# Patient Record
Sex: Female | Born: 1975 | Race: Black or African American | Hispanic: No | Marital: Married | Smoking: Never smoker
Health system: Southern US, Community
[De-identification: ages and names within clinical notes are randomized; demographics above are authoritative.]

## PROBLEM LIST (undated history)

## (undated) DIAGNOSIS — I10 Essential (primary) hypertension: Secondary | ICD-10-CM

## (undated) DIAGNOSIS — G629 Polyneuropathy, unspecified: Secondary | ICD-10-CM

## (undated) DIAGNOSIS — I219 Acute myocardial infarction, unspecified: Secondary | ICD-10-CM

## (undated) DIAGNOSIS — E669 Obesity, unspecified: Secondary | ICD-10-CM

## (undated) DIAGNOSIS — E119 Type 2 diabetes mellitus without complications: Secondary | ICD-10-CM

## (undated) HISTORY — PX: CAROTID STENT: SHX1301

## (undated) HISTORY — PX: TUBAL LIGATION: SHX77

## (undated) HISTORY — PX: ABLATION: SHX5711

---

## 2013-03-27 ENCOUNTER — Emergency Department (HOSPITAL_BASED_OUTPATIENT_CLINIC_OR_DEPARTMENT_OTHER)
Admission: EM | Admit: 2013-03-27 | Discharge: 2013-03-27 | Disposition: A | Discharge status: Home / Self Care | Payer: Medicaid Other | Attending: Emergency Medicine | Admitting: Emergency Medicine

## 2013-03-27 ENCOUNTER — Encounter (HOSPITAL_BASED_OUTPATIENT_CLINIC_OR_DEPARTMENT_OTHER): Payer: Self-pay | Admitting: Emergency Medicine

## 2013-03-27 DIAGNOSIS — Z8669 Personal history of other diseases of the nervous system and sense organs: Secondary | ICD-10-CM | POA: Insufficient documentation

## 2013-03-27 DIAGNOSIS — E119 Type 2 diabetes mellitus without complications: Secondary | ICD-10-CM | POA: Insufficient documentation

## 2013-03-27 DIAGNOSIS — I252 Old myocardial infarction: Secondary | ICD-10-CM | POA: Insufficient documentation

## 2013-03-27 DIAGNOSIS — Z7982 Long term (current) use of aspirin: Secondary | ICD-10-CM | POA: Insufficient documentation

## 2013-03-27 DIAGNOSIS — I10 Essential (primary) hypertension: Secondary | ICD-10-CM | POA: Insufficient documentation

## 2013-03-27 DIAGNOSIS — Z9889 Other specified postprocedural states: Secondary | ICD-10-CM | POA: Insufficient documentation

## 2013-03-27 DIAGNOSIS — Z7902 Long term (current) use of antithrombotics/antiplatelets: Secondary | ICD-10-CM | POA: Insufficient documentation

## 2013-03-27 DIAGNOSIS — E876 Hypokalemia: Secondary | ICD-10-CM | POA: Insufficient documentation

## 2013-03-27 DIAGNOSIS — Z880 Allergy status to penicillin: Secondary | ICD-10-CM | POA: Insufficient documentation

## 2013-03-27 DIAGNOSIS — Z79899 Other long term (current) drug therapy: Secondary | ICD-10-CM | POA: Insufficient documentation

## 2013-03-27 DIAGNOSIS — M25519 Pain in unspecified shoulder: Secondary | ICD-10-CM | POA: Insufficient documentation

## 2013-03-27 DIAGNOSIS — M25512 Pain in left shoulder: Secondary | ICD-10-CM

## 2013-03-27 DIAGNOSIS — Z794 Long term (current) use of insulin: Secondary | ICD-10-CM | POA: Insufficient documentation

## 2013-03-27 HISTORY — DX: Type 2 diabetes mellitus without complications: E11.9

## 2013-03-27 HISTORY — DX: Acute myocardial infarction, unspecified: I21.9

## 2013-03-27 HISTORY — DX: Polyneuropathy, unspecified: G62.9

## 2013-03-27 HISTORY — DX: Essential (primary) hypertension: I10

## 2013-03-27 LAB — CBC WITH DIFFERENTIAL/PLATELET
Basophils Absolute: 0 10*3/uL (ref 0.0–0.1)
Basophils Relative: 0 % (ref 0–1)
EOS ABS: 0.2 10*3/uL (ref 0.0–0.7)
Eosinophils Relative: 2 % (ref 0–5)
HCT: 39.9 % (ref 36.0–46.0)
HEMOGLOBIN: 13.2 g/dL (ref 12.0–15.0)
LYMPHS ABS: 4.3 10*3/uL — AB (ref 0.7–4.0)
LYMPHS PCT: 42 % (ref 12–46)
MCH: 26.6 pg (ref 26.0–34.0)
MCHC: 33.1 g/dL (ref 30.0–36.0)
MCV: 80.4 fL (ref 78.0–100.0)
Monocytes Absolute: 0.7 10*3/uL (ref 0.1–1.0)
Monocytes Relative: 7 % (ref 3–12)
NEUTROS ABS: 4.9 10*3/uL (ref 1.7–7.7)
NEUTROS PCT: 48 % (ref 43–77)
PLATELETS: 441 10*3/uL — AB (ref 150–400)
RBC: 4.96 MIL/uL (ref 3.87–5.11)
RDW: 14.6 % (ref 11.5–15.5)
WBC: 10.1 10*3/uL (ref 4.0–10.5)

## 2013-03-27 LAB — COMPREHENSIVE METABOLIC PANEL
ALK PHOS: 70 U/L (ref 39–117)
ALT: 17 U/L (ref 0–35)
AST: 16 U/L (ref 0–37)
Albumin: 3.8 g/dL (ref 3.5–5.2)
BUN: 16 mg/dL (ref 6–23)
CALCIUM: 9.7 mg/dL (ref 8.4–10.5)
CHLORIDE: 96 meq/L (ref 96–112)
CO2: 30 meq/L (ref 19–32)
Creatinine, Ser: 1 mg/dL (ref 0.50–1.10)
GFR calc Af Amer: 81 mL/min — ABNORMAL LOW (ref >90)
GFR, EST NON AFRICAN AMERICAN: 70 mL/min — AB (ref >90)
Glucose, Bld: 241 mg/dL — ABNORMAL HIGH (ref 70–99)
POTASSIUM: 2.9 meq/L — AB (ref 3.7–5.3)
SODIUM: 140 meq/L (ref 137–147)
Total Bilirubin: 0.5 mg/dL (ref 0.3–1.2)
Total Protein: 8.2 g/dL (ref 6.0–8.3)

## 2013-03-27 LAB — TROPONIN I: Troponin I: <0.3 ng/mL (ref <0.30)

## 2013-03-27 MED ORDER — POTASSIUM CHLORIDE ER 20 MEQ PO TBCR: 10.0000 meq | EXTENDED_RELEASE_TABLET | Freq: Two times a day (BID) | ORAL | Status: DC

## 2013-03-27 MED ORDER — CYCLOBENZAPRINE HCL 10 MG PO TABS: 10.0000 mg | ORAL_TABLET | Freq: Three times a day (TID) | ORAL | Status: DC | PRN

## 2013-03-27 MED ORDER — HYDROCODONE-ACETAMINOPHEN 7.5-325 MG PO TABS: 1.0000 | ORAL_TABLET | Freq: Four times a day (QID) | ORAL | Status: DC | PRN

## 2013-03-27 NOTE — ED Provider Notes (Signed)
I saw and evaluated the patient, reviewed the resident's note and I agree with the findings and plan.   EKG Interpretation   Date/Time:  Tuesday March 27 2013 08:42:04 EST Ventricular Rate:  76 PR Interval:  194 QRS Duration: 96 QT Interval:  420 QTC Calculation: 472 R Axis:   29 Text Interpretation:  Normal sinus rhythm Nonspecific T wave abnormality  Prolonged QT No previous tracing Confirmed by Anitra LauthPLUNKETT  MD, Alphonzo LemmingsWHITNEY  [phone]([id number]) on 03/27/2013 9:23:52 AM      I have reviewed EKG and agree with the resident interpretation.    Pt with atypical chest pain that appears to be musculoskeletal that started in the shoulder and reproduced with palpation.  No associated sx and no lower chest pain or substernal pain.  Pt is N/V intact and pt well appearing.  Pain started 6hours pta with neg trop and labs other than mild hypokalemia.  Plain films neg.  Discussed findings with pt and she is comfortable going home and return for worsening sx.  Given muscle relaxor, pain control and f/u.  Gwyneth SproutWhitney Zarielle Cea, MD 03/27/13 1537

## 2013-03-27 NOTE — ED Notes (Signed)
Pt states yesterday her mid chest was a little achy thought she was tired this am at 0430 at work started having more discomfort pain in left arm and shoulder as well as some discomfort in her left jaw

## 2013-03-27 NOTE — ED Provider Notes (Signed)
CSN: [id number]     Arrival date & time 03/27/13  0828 History   First MD Initiated Contact with Patient 03/27/13 [phone]     Chief Complaint  Patient presents with  . left arm pain jaw pain    HPI  Theresa Maxwell is 38 y.o. female with a prior history of HTN, DM and MI (09/2011) and recent carotid stent placement (09/2011).Three weeks ago she noticed left shoulder aches when she is lying down. She does not sleep on that side. She reports the pain as throbbing and aching and radiates to breast area. Pain stays in shoulder and does not radiates down arm. Patient works with her Water engineer. Last night (4:30 am) she noticed chest discomfort in the center of her chest, that waxs and wanes. She rates that discomfort as 6/10. She reports the "pain would occur every 2 minutes, lasting as long as an hour at times." She has not taken anything for the pain. She can not take NSAIDS d/t plavix use. She denies injury or trauma.  She takes her ASA and plavix daily as recommended. She denies nausea, vomit or diaphoresis. She has not had fever, cough, shortness of breath or abdominal discomfort. She follows regularly with cardiologist at [facility], recently seen in January.   Past Medical History  Diagnosis Date  . Hypertension   . Diabetes mellitus without complication   . Heart attack   . Neuropathy    Past Surgical History  Procedure Laterality Date  . Carotid stent    . Ablation    . Tubal ligation     History reviewed. No pertinent family history. History  Substance Use Topics  . Smoking status: Never Smoker   . Smokeless tobacco: Not on file  . Alcohol Use: No   OB History   Grav Para Term Preterm Abortions TAB SAB Ect Mult Living                 Review of Systems    Allergies  Penicillins and Percocet  Home Medications   Current Outpatient Rx  Name  Route  Sig  Dispense  Refill  . amLODipine (NORVASC) 10 MG tablet   Oral   Take 5 mg by mouth daily.         Marland Kitchen aspirin  81 MG tablet   Oral   Take 81 mg by mouth daily.         . clopidogrel (PLAVIX) 75 MG tablet   Oral   Take 75 mg by mouth daily with breakfast.         . hydrochlorothiazide (HYDRODIURIL) 50 MG tablet   Oral   Take 50 mg by mouth daily.         . insulin glargine (LANTUS) 100 UNIT/ML injection   Subcutaneous   Inject into the skin at bedtime.         Marland Kitchen losartan (COZAAR) 25 MG tablet   Oral   Take 25 mg by mouth daily.         . metoprolol tartrate (LOPRESSOR) 25 MG tablet   Oral   Take 25 mg by mouth 2 (two) times daily.          BP 115/74  Pulse 68  Temp(Src) 98.7 F (37.1 C) (Oral)  Resp 20  Ht 5\' 9"  (1.753 m)  Wt 353 lb (160.12 kg)  BMI 52.11 kg/m2  SpO2 98%  LMP 03/26/2013 Physical Exam Gen: NAD.  HEENT: AT. [place]. Bilateral TM visualized and normal  in appearance. Bilateral eyes with injections, no icterus. MMM.  CV: RRR, no murmur Chest: CTAB, no wheeze or crackles. TTP over pectoralis.  Abd: Soft. Morbidly obese. NTND. BS present.  Ext: No erythema. Trace edema.  Skin: No rashes, purpura or petechiae.  Neuro:  PERLA. EOMi. Alert. Grossly intact.  MSK: TTP over pectoralis/chest wall on left. Pain with flexion against resistance in left chest and shoulder.   ED Course  Procedures (including critical care time) Labs Review Labs Reviewed  CBC WITH DIFFERENTIAL - Abnormal; Notable for the following:    Platelets 441 (*)    Lymphs Abs 4.3 (*)    All other components within normal limits  COMPREHENSIVE METABOLIC PANEL - Abnormal; Notable for the following:    Potassium 2.9 (*)    Glucose, Bld 241 (*)    GFR calc non Af Amer 70 (*)    GFR calc Af Amer 81 (*)    All other components within normal limits  TROPONIN I   Imaging Review No results found.   EKG Interpretation   Date/Time:  Tuesday March 27 2013 08:42:04 EST Ventricular Rate:  76 PR Interval:  194 QRS Duration: 96 QT Interval:  420 QTC Calculation: 472 R Axis:   29 Text  Interpretation:  Normal sinus rhythm Nonspecific T wave abnormality  Prolonged QT No previous tracing Confirmed by Anitra LauthPLUNKETT  MD, WHITNEY  [phone]([id number]) on 03/27/2013 9:23:52 AM      MDM   Final diagnoses:  None   Patient was monitored, EKG obtained and was normal, outside of mild prolonged QT. Labs resulted with negative trop and low potassium (2.9). Initial BP was elevated, with repeat BP 115/74. Chest pain was reproducible to palpation. With Physical exam and negative trop and ekg pain is likely d/t musculoskeletal pain. Patient given prescription for flexeril, Vicodin and potassium. Encourage to use heat to shoulder/pectoralis. She was advised to follow up with her PCP and cardiologist. She was given a work excuse for light restriction for 1 week.     [name], DO 03/27/13 1024

## 2013-03-27 NOTE — Discharge Instructions (Signed)

## 2013-03-31 ENCOUNTER — Emergency Department (HOSPITAL_BASED_OUTPATIENT_CLINIC_OR_DEPARTMENT_OTHER)
Admission: EM | Admit: 2013-03-31 | Discharge: 2013-03-31 | Disposition: A | Discharge status: Home / Self Care | Payer: Medicaid Other | Attending: Emergency Medicine | Admitting: Emergency Medicine

## 2013-03-31 ENCOUNTER — Encounter (HOSPITAL_BASED_OUTPATIENT_CLINIC_OR_DEPARTMENT_OTHER): Payer: Self-pay | Admitting: Emergency Medicine

## 2013-03-31 DIAGNOSIS — Z794 Long term (current) use of insulin: Secondary | ICD-10-CM | POA: Insufficient documentation

## 2013-03-31 DIAGNOSIS — Z7982 Long term (current) use of aspirin: Secondary | ICD-10-CM | POA: Insufficient documentation

## 2013-03-31 DIAGNOSIS — E119 Type 2 diabetes mellitus without complications: Secondary | ICD-10-CM | POA: Insufficient documentation

## 2013-03-31 DIAGNOSIS — Z79899 Other long term (current) drug therapy: Secondary | ICD-10-CM | POA: Insufficient documentation

## 2013-03-31 DIAGNOSIS — I252 Old myocardial infarction: Secondary | ICD-10-CM | POA: Insufficient documentation

## 2013-03-31 DIAGNOSIS — Z8669 Personal history of other diseases of the nervous system and sense organs: Secondary | ICD-10-CM | POA: Insufficient documentation

## 2013-03-31 DIAGNOSIS — I10 Essential (primary) hypertension: Secondary | ICD-10-CM | POA: Insufficient documentation

## 2013-03-31 DIAGNOSIS — J029 Acute pharyngitis, unspecified: Secondary | ICD-10-CM | POA: Insufficient documentation

## 2013-03-31 DIAGNOSIS — R51 Headache: Secondary | ICD-10-CM | POA: Insufficient documentation

## 2013-03-31 DIAGNOSIS — E669 Obesity, unspecified: Secondary | ICD-10-CM | POA: Insufficient documentation

## 2013-03-31 DIAGNOSIS — Z7902 Long term (current) use of antithrombotics/antiplatelets: Secondary | ICD-10-CM | POA: Insufficient documentation

## 2013-03-31 DIAGNOSIS — Z880 Allergy status to penicillin: Secondary | ICD-10-CM | POA: Insufficient documentation

## 2013-03-31 HISTORY — DX: Obesity, unspecified: E66.9

## 2013-03-31 LAB — RAPID STREP SCREEN (MED CTR MEBANE ONLY): Streptococcus, Group A Screen (Direct): NEGATIVE

## 2013-03-31 MED ORDER — HYDROCODONE-ACETAMINOPHEN 7.5-325 MG/15ML PO SOLN: 10.0000 mL | ORAL | Status: DC | PRN

## 2013-03-31 NOTE — ED Notes (Signed)
Sore throat, cough and HA that started yesterday. Pt only able to speak in whisper.

## 2013-03-31 NOTE — ED Provider Notes (Signed)
CSN: [id number]     Arrival date & time 03/31/13  0207 History   First MD Initiated Contact with Patient 03/31/13 0310     Chief Complaint  Patient presents with  . Sore Throat     (Consider location/radiation/quality/duration/timing/severity/associated sxs/prior Treatment) HPI This is a 38 year old female with a two-day history of sore throat, cough, headache and dysphonia. It is worsened. Her symptoms are moderate. She denies fever, chills, shortness of breath, nausea, vomiting or diarrhea. Her sore throat is worse with swallowing. She is also having some pain in her left ear.  Past Medical History  Diagnosis Date  . Hypertension   . Diabetes mellitus without complication   . Heart attack   . Neuropathy   . Obesity    Past Surgical History  Procedure Laterality Date  . Carotid stent    . Ablation    . Tubal ligation     No family history on file. History  Substance Use Topics  . Smoking status: Never Smoker   . Smokeless tobacco: Not on file  . Alcohol Use: No   OB History   Grav Para Term Preterm Abortions TAB SAB Ect Mult Living                 Review of Systems  All other systems reviewed and are negative.      Allergies  Penicillins and Percocet  Home Medications   Current Outpatient Rx  Name  Route  Sig  Dispense  Refill  . Canagliflozin (INVOKANA) 100 MG TABS   Oral   Take 100 mg by mouth daily.         Marland Kitchen amLODipine (NORVASC) 10 MG tablet   Oral   Take 5 mg by mouth daily.         Marland Kitchen aspirin 81 MG tablet   Oral   Take 81 mg by mouth daily.         . clopidogrel (PLAVIX) 75 MG tablet   Oral   Take 75 mg by mouth daily with breakfast.         . cyclobenzaprine (FLEXERIL) 10 MG tablet   Oral   Take 1 tablet (10 mg total) by mouth 3 (three) times daily as needed for muscle spasms.   15 tablet   0   . hydrochlorothiazide (HYDRODIURIL) 50 MG tablet   Oral   Take 50 mg by mouth daily.         Marland Kitchen HYDROcodone-acetaminophen (NORCO)  7.5-325 MG per tablet   Oral   Take 1 tablet by mouth every 6 (six) hours as needed for moderate pain.   15 tablet   0   . insulin glargine (LANTUS) 100 UNIT/ML injection   Subcutaneous   Inject into the skin at bedtime.         Marland Kitchen losartan (COZAAR) 25 MG tablet   Oral   Take 25 mg by mouth daily.         . metoprolol tartrate (LOPRESSOR) 25 MG tablet   Oral   Take 25 mg by mouth 2 (two) times daily.         . potassium chloride 20 MEQ TBCR   Oral   Take 10 mEq by mouth 2 (two) times daily.   10 tablet   0    BP 133/80  Pulse 89  Temp(Src) 98.8 F (37.1 C) (Oral)  Resp 16  Ht 5\' 9"  (1.753 m)  Wt 353 lb (160.12 kg)  BMI 52.11 kg/m2  SpO2  99%  LMP 03/26/2013  Physical Exam General: Well-developed, obese female in no acute distress; appearance consistent with age of record HENT: normocephalic; atraumatic; TMs normal; a few petechiae of the soft palate; tonsils enlarged without exudate; dysphonia; no trismus; uvula midline Eyes: pupils equal, round and reactive to light; extraocular muscles intact Neck: supple; no lymphadenopathy Heart: regular rate and rhythm Lungs: clear to auscultation bilaterally Abdomen: soft; obese; nontender; bowel sounds present Extremities: No deformity; full range of motion Neurologic: Awake, alert and oriented; motor function intact in all extremities and symmetric; no facial droop Skin: Warm and dry Psychiatric: Normal mood and affect    ED Course  Procedures (including critical care time)   MDM   Nursing notes and vitals signs, including pulse oximetry, reviewed.  Summary of this visit's results, reviewed by myself:  Labs:  Results for orders placed during the hospital encounter of 03/31/13 (from the past 24 hour(s))  RAPID STREP SCREEN     Status: None   Collection Time    03/31/13  2:50 AM      Result Value Ref Range   Streptococcus, Group A Screen (Direct) NEGATIVE  NEGATIVE       Hanley SeamenJohn L Seward Coran, MD 03/31/13  [phone]

## 2013-04-02 LAB — CULTURE, GROUP A STREP

## 2013-05-24 ENCOUNTER — Emergency Department (HOSPITAL_BASED_OUTPATIENT_CLINIC_OR_DEPARTMENT_OTHER)
Admission: EM | Admit: 2013-05-24 | Discharge: 2013-05-24 | Disposition: A | Discharge status: Other Acute Inpatient Hospital | Payer: Medicaid Other | Attending: Emergency Medicine | Admitting: Emergency Medicine

## 2013-05-24 ENCOUNTER — Encounter (HOSPITAL_BASED_OUTPATIENT_CLINIC_OR_DEPARTMENT_OTHER): Payer: Self-pay | Admitting: Emergency Medicine

## 2013-05-24 DIAGNOSIS — E119 Type 2 diabetes mellitus without complications: Secondary | ICD-10-CM | POA: Diagnosis not present

## 2013-05-24 DIAGNOSIS — Z8669 Personal history of other diseases of the nervous system and sense organs: Secondary | ICD-10-CM | POA: Insufficient documentation

## 2013-05-24 DIAGNOSIS — I10 Essential (primary) hypertension: Secondary | ICD-10-CM | POA: Diagnosis not present

## 2013-05-24 DIAGNOSIS — I2510 Atherosclerotic heart disease of native coronary artery without angina pectoris: Secondary | ICD-10-CM | POA: Diagnosis not present

## 2013-05-24 DIAGNOSIS — Z79899 Other long term (current) drug therapy: Secondary | ICD-10-CM | POA: Diagnosis not present

## 2013-05-24 DIAGNOSIS — E669 Obesity, unspecified: Secondary | ICD-10-CM | POA: Diagnosis not present

## 2013-05-24 DIAGNOSIS — Z7982 Long term (current) use of aspirin: Secondary | ICD-10-CM | POA: Insufficient documentation

## 2013-05-24 DIAGNOSIS — R079 Chest pain, unspecified: Secondary | ICD-10-CM

## 2013-05-24 DIAGNOSIS — Z9861 Coronary angioplasty status: Secondary | ICD-10-CM | POA: Diagnosis not present

## 2013-05-24 DIAGNOSIS — Z880 Allergy status to penicillin: Secondary | ICD-10-CM | POA: Diagnosis not present

## 2013-05-24 DIAGNOSIS — Z7902 Long term (current) use of antithrombotics/antiplatelets: Secondary | ICD-10-CM | POA: Insufficient documentation

## 2013-05-24 DIAGNOSIS — Z794 Long term (current) use of insulin: Secondary | ICD-10-CM | POA: Insufficient documentation

## 2013-05-24 DIAGNOSIS — I252 Old myocardial infarction: Secondary | ICD-10-CM | POA: Insufficient documentation

## 2013-05-24 LAB — BASIC METABOLIC PANEL
BUN: 12 mg/dL (ref 6–23)
CHLORIDE: 99 meq/L (ref 96–112)
CO2: 30 meq/L (ref 19–32)
Calcium: 9.5 mg/dL (ref 8.4–10.5)
Creatinine, Ser: 0.8 mg/dL (ref 0.50–1.10)
GFR calc Af Amer: >90 mL/min (ref >90)
GFR calc non Af Amer: >90 mL/min (ref >90)
Glucose, Bld: 146 mg/dL — ABNORMAL HIGH (ref 70–99)
Potassium: 3 mEq/L — ABNORMAL LOW (ref 3.7–5.3)
SODIUM: 141 meq/L (ref 137–147)

## 2013-05-24 LAB — CBC WITH DIFFERENTIAL/PLATELET
Basophils Absolute: 0 10*3/uL (ref 0.0–0.1)
Basophils Relative: 0 % (ref 0–1)
EOS PCT: 2 % (ref 0–5)
Eosinophils Absolute: 0.2 10*3/uL (ref 0.0–0.7)
HCT: 39.7 % (ref 36.0–46.0)
Hemoglobin: 13.3 g/dL (ref 12.0–15.0)
LYMPHS ABS: 3.6 10*3/uL (ref 0.7–4.0)
Lymphocytes Relative: 31 % (ref 12–46)
MCH: 27.1 pg (ref 26.0–34.0)
MCHC: 33.5 g/dL (ref 30.0–36.0)
MCV: 81 fL (ref 78.0–100.0)
Monocytes Absolute: 0.7 10*3/uL (ref 0.1–1.0)
Monocytes Relative: 6 % (ref 3–12)
NEUTROS PCT: 61 % (ref 43–77)
Neutro Abs: 7.1 10*3/uL (ref 1.7–7.7)
PLATELETS: 446 10*3/uL — AB (ref 150–400)
RBC: 4.9 MIL/uL (ref 3.87–5.11)
RDW: 14.1 % (ref 11.5–15.5)
WBC: 11.6 10*3/uL — ABNORMAL HIGH (ref 4.0–10.5)

## 2013-05-24 LAB — TROPONIN I

## 2013-05-24 MED ORDER — POTASSIUM CHLORIDE 20 MEQ/15ML (10%) PO LIQD: 20.0000 meq | Freq: Every day | ORAL | Status: DC

## 2013-05-24 MED ORDER — POTASSIUM CHLORIDE CRYS ER 20 MEQ PO TBCR
40.0000 meq | EXTENDED_RELEASE_TABLET | Freq: Once | ORAL | Status: AC
  Administered 2013-05-24: 40 meq via ORAL
  Filled 2013-05-24: qty 2

## 2013-05-24 MED ORDER — NITROGLYCERIN 0.4 MG SL SUBL
0.4000 mg | SUBLINGUAL_TABLET | SUBLINGUAL | Status: DC | PRN
  Filled 2013-05-24: qty 25
  Filled 2013-05-24: qty 1

## 2013-05-24 MED ORDER — ASPIRIN 81 MG PO CHEW
324.0000 mg | CHEWABLE_TABLET | Freq: Once | ORAL | Status: AC
  Administered 2013-05-24: 324 mg via ORAL
  Filled 2013-05-24: qty 4

## 2013-05-24 NOTE — ED Notes (Signed)
Pt denies chest pain at this time, SL NTG not given

## 2013-05-24 NOTE — ED Notes (Signed)
Chest pain started while working around 1 am, denies n/v, no SOB or diaphoresis.

## 2013-05-24 NOTE — ED Notes (Signed)
Pt states that her last name was spelled incorrectly as Merilynn FinlandRobertson previously and it was corrected to ThermalitoRobinson. Therefore, the  EKG is reading as a mismatch now.

## 2013-05-24 NOTE — ED Provider Notes (Signed)
CSN: [id number]     Arrival date & time 05/24/13  0256 History   First MD Initiated Contact with Patient 05/24/13 [phone]     Chief Complaint  Patient presents with  . Chest Pain     (Consider location/radiation/quality/duration/timing/severity/associated sxs/prior Treatment) HPI 38 year old female with a history of diabetes mellitus and coronary artery disease status post cardiac stent. She is here with chest pain that began about 2 hours ago. The pain was initially in her right jaw and then moved down into her substernal region. She describes it as a dull pain but characterized as like her previous MI. It has been severe at its worst. It has been coming and going. It is worse with activity and better with rest. There is no associated shortness of breath, diaphoresis, nausea or vomiting. The pain is not present at this time.  Past Medical History  Diagnosis Date  . Hypertension   . Diabetes mellitus without complication   . Heart attack   . Neuropathy   . Obesity    Past Surgical History  Procedure Laterality Date  . Carotid stent    . Ablation    . Tubal ligation     History reviewed. No pertinent family history. History  Substance Use Topics  . Smoking status: Never Smoker   . Smokeless tobacco: Not on file  . Alcohol Use: No   OB History   Grav Para Term Preterm Abortions TAB SAB Ect Mult Living                 Review of Systems  All other systems reviewed and are negative.  Allergies  Penicillins and Percocet  Home Medications   Prior to Admission medications   Medication Sig Start Date End Date Taking? Authorizing Provider  amLODipine (NORVASC) 10 MG tablet Take 5 mg by mouth daily.   Yes Historical Provider, MD  aspirin 81 MG tablet Take 81 mg by mouth daily.   Yes Historical Provider, MD  Canagliflozin (INVOKANA) 100 MG TABS Take 100 mg by mouth daily.   Yes Historical Provider, MD  clopidogrel (PLAVIX) 75 MG tablet Take 75 mg by mouth daily with breakfast.    Yes Historical Provider, MD  hydrochlorothiazide (HYDRODIURIL) 50 MG tablet Take 50 mg by mouth daily.   Yes Historical Provider, MD  insulin glargine (LANTUS) 100 UNIT/ML injection Inject into the skin at bedtime.   Yes Historical Provider, MD  losartan (COZAAR) 25 MG tablet Take 25 mg by mouth daily.   Yes Historical Provider, MD  metoprolol tartrate (LOPRESSOR) 25 MG tablet Take 25 mg by mouth 2 (two) times daily.   Yes Historical Provider, MD  cyclobenzaprine (FLEXERIL) 10 MG tablet Take 1 tablet (10 mg total) by mouth 3 (three) times daily as needed for muscle spasms. 03/27/13   Renee A Kuneff, DO  HYDROcodone-acetaminophen (HYCET) 7.5-325 mg/15 ml solution Take 10 mLs by mouth every 4 (four) hours as needed (for pain). 03/31/13   Carlisle BeersJohn L Taisley Mordan, MD  HYDROcodone-acetaminophen (NORCO) 7.5-325 MG per tablet Take 1 tablet by mouth every 6 (six) hours as needed for moderate pain. 03/27/13   Renee A Kuneff, DO  potassium chloride 20 MEQ TBCR Take 10 mEq by mouth 2 (two) times daily. 03/27/13   Renee A Kuneff, DO   BP 158/73  Pulse 80  Temp(Src) 98.6 F (37 C) (Oral)  Resp 18  Ht 5\' 9"  (1.753 m)  Wt 300 lb (136.079 kg)  BMI 44.28 kg/m2  SpO2 98%  Physical  Exam General: Well-developed, obese female in no acute distress; appearance consistent with age of record HENT: normocephalic; atraumatic Eyes: pupils equal, round and reactive to light; extraocular muscles intact Neck: supple Heart: regular rate and rhythm; no murmurs, rubs or gallops Lungs: clear to auscultation bilaterally Abdomen: soft; nondistended; nontender; bowel sounds present Extremities: No deformity; full range of motion; pulses normal Neurologic: Awake, alert and oriented; motor function intact in all extremities and symmetric; no facial droop Skin: Warm and dry Psychiatric: Normal mood and affect    ED Course  Procedures (including critical care time)   MDM  EKG Interpretation:  Date & Time: 05/24/2013 3:08 AM  Rate:  79  Rhythm: normal sinus rhythm  QRS Axis: normal  Intervals: QT prolonged  ST/T Wave abnormalities: nonspecific T wave changes  Conduction Disutrbances:none  Narrative Interpretation:   Old EKG Reviewed: unchanged  Nursing notes and vitals signs, including pulse oximetry, reviewed.  Summary of this visit's results, reviewed by myself:  Labs:  Results for orders placed during the hospital encounter of 05/24/13 (from the past 24 hour(s))  CBC WITH DIFFERENTIAL     Status: Abnormal   Collection Time    05/24/13  3:21 AM      Result Value Ref Range   WBC 11.6 (*) 4.0 - 10.5 K/uL   RBC 4.90  3.87 - 5.11 MIL/uL   Hemoglobin 13.3  12.0 - 15.0 g/dL   HCT 16.139.7  09.636.0 - 04.546.0 %   MCV 81.0  78.0 - 100.0 fL   MCH 27.1  26.0 - 34.0 pg   MCHC 33.5  30.0 - 36.0 g/dL   RDW 40.914.1  81.111.5 - 91.415.5 %   Platelets 446 (*) 150 - 400 K/uL   Neutrophils Relative % 61  43 - 77 %   Lymphocytes Relative 31  12 - 46 %   Monocytes Relative 6  3 - 12 %   Eosinophils Relative 2  0 - 5 %   Basophils Relative 0  0 - 1 %   Neutro Abs 7.1  1.7 - 7.7 K/uL   Lymphs Abs 3.6  0.7 - 4.0 K/uL   Monocytes Absolute 0.7  0.1 - 1.0 K/uL   Eosinophils Absolute 0.2  0.0 - 0.7 K/uL   Basophils Absolute 0.0  0.0 - 0.1 K/uL   Smear Review LARGE PLATELETS PRESENT    BASIC METABOLIC PANEL     Status: Abnormal   Collection Time    05/24/13  3:21 AM      Result Value Ref Range   Sodium 141  137 - 147 mEq/L   Potassium 3.0 (*) 3.7 - 5.3 mEq/L   Chloride 99  96 - 112 mEq/L   CO2 30  19 - 32 mEq/L   Glucose, Bld 146 (*) 70 - 99 mg/dL   BUN 12  6 - 23 mg/dL   Creatinine, Ser 7.820.80  0.50 - 1.10 mg/dL   Calcium 9.5  8.4 - 95.610.5 mg/dL   GFR calc non Af Amer >90  >90 mL/min   GFR calc Af Amer >90  >90 mL/min  TROPONIN I     Status: None   Collection Time    05/24/13  3:21 AM      Result Value Ref Range   Troponin I <0.30  <0.30 ng/mL   4:29 AM [facility]   Hanley SeamenJohn L Shaindel Sweeten,  MD 05/24/13 0430

## 2013-08-05 ENCOUNTER — Emergency Department (HOSPITAL_BASED_OUTPATIENT_CLINIC_OR_DEPARTMENT_OTHER)
Admission: EM | Admit: 2013-08-05 | Discharge: 2013-08-06 | Disposition: A | Discharge status: Home / Self Care | Payer: Medicaid Other | Attending: Emergency Medicine | Admitting: Emergency Medicine

## 2013-08-05 ENCOUNTER — Encounter (HOSPITAL_BASED_OUTPATIENT_CLINIC_OR_DEPARTMENT_OTHER): Payer: Self-pay | Admitting: Emergency Medicine

## 2013-08-05 DIAGNOSIS — I10 Essential (primary) hypertension: Secondary | ICD-10-CM | POA: Insufficient documentation

## 2013-08-05 DIAGNOSIS — Z7902 Long term (current) use of antithrombotics/antiplatelets: Secondary | ICD-10-CM | POA: Diagnosis not present

## 2013-08-05 DIAGNOSIS — R197 Diarrhea, unspecified: Secondary | ICD-10-CM | POA: Diagnosis not present

## 2013-08-05 DIAGNOSIS — E669 Obesity, unspecified: Secondary | ICD-10-CM | POA: Insufficient documentation

## 2013-08-05 DIAGNOSIS — Z3202 Encounter for pregnancy test, result negative: Secondary | ICD-10-CM | POA: Diagnosis not present

## 2013-08-05 DIAGNOSIS — R51 Headache: Secondary | ICD-10-CM | POA: Diagnosis not present

## 2013-08-05 DIAGNOSIS — R112 Nausea with vomiting, unspecified: Secondary | ICD-10-CM | POA: Diagnosis not present

## 2013-08-05 DIAGNOSIS — Z9851 Tubal ligation status: Secondary | ICD-10-CM | POA: Insufficient documentation

## 2013-08-05 DIAGNOSIS — R05 Cough: Secondary | ICD-10-CM | POA: Insufficient documentation

## 2013-08-05 DIAGNOSIS — Z880 Allergy status to penicillin: Secondary | ICD-10-CM | POA: Diagnosis not present

## 2013-08-05 DIAGNOSIS — Z7982 Long term (current) use of aspirin: Secondary | ICD-10-CM | POA: Insufficient documentation

## 2013-08-05 DIAGNOSIS — Z8669 Personal history of other diseases of the nervous system and sense organs: Secondary | ICD-10-CM | POA: Insufficient documentation

## 2013-08-05 DIAGNOSIS — R059 Cough, unspecified: Secondary | ICD-10-CM | POA: Diagnosis not present

## 2013-08-05 DIAGNOSIS — R1110 Vomiting, unspecified: Secondary | ICD-10-CM

## 2013-08-05 DIAGNOSIS — R519 Headache, unspecified: Secondary | ICD-10-CM

## 2013-08-05 DIAGNOSIS — Z794 Long term (current) use of insulin: Secondary | ICD-10-CM | POA: Diagnosis not present

## 2013-08-05 DIAGNOSIS — R1084 Generalized abdominal pain: Secondary | ICD-10-CM | POA: Diagnosis present

## 2013-08-05 DIAGNOSIS — Z9861 Coronary angioplasty status: Secondary | ICD-10-CM | POA: Diagnosis not present

## 2013-08-05 DIAGNOSIS — E119 Type 2 diabetes mellitus without complications: Secondary | ICD-10-CM | POA: Insufficient documentation

## 2013-08-05 DIAGNOSIS — Z79899 Other long term (current) drug therapy: Secondary | ICD-10-CM | POA: Diagnosis not present

## 2013-08-05 DIAGNOSIS — R1013 Epigastric pain: Secondary | ICD-10-CM | POA: Diagnosis not present

## 2013-08-05 DIAGNOSIS — I252 Old myocardial infarction: Secondary | ICD-10-CM | POA: Diagnosis not present

## 2013-08-05 MED ORDER — ONDANSETRON 8 MG PO TBDP: 8.0000 mg | ORAL_TABLET | Freq: Once | ORAL | Status: DC

## 2013-08-05 NOTE — ED Provider Notes (Signed)
CSN: [id number]     Arrival date & time 08/05/13  2344 History  This chart was scribed for Theresa Gaskinsonald W Concha Sudol, MD by Modena JanskyAlbert Thayil, ED Scribe. This patient was seen in room MH03/MH03 and the patient's care was started at 11:58 PM.   Chief Complaint  Patient presents with  . Abdominal Pain   Patient is a 38 y.o. female presenting with abdominal pain. The history is provided by the patient. No language interpreter was used.  Abdominal Pain Pain location:  Generalized Pain severity:  Moderate Onset quality:  Gradual Timing:  Intermittent Progression:  Unchanged Relieved by:  None tried Worsened by:  Nothing tried Ineffective treatments:  None tried Associated symptoms: cough, diarrhea, nausea and vomiting    HPI Comments: Theresa Maxwell is a 38 y.o. female with a hx of DM and HTN who presents to the Emergency Department complaining of emesis that started yesterday. She reports no hematochezia. She states that she has abdominal pain, which she describes as "knots" in her stomach. She states that she has severe nausea, diarrhea, cough, and headache. She states that she has a hx of MI and stents. She reports that she has been around sick contacts. She denies any hx of abdominal surgery.  Past Medical History  Diagnosis Date  . Hypertension   . Diabetes mellitus without complication   . Heart attack   . Neuropathy   . Obesity    Past Surgical History  Procedure Laterality Date  . Carotid stent    . Ablation    . Tubal ligation     No family history on file. History  Substance Use Topics  . Smoking status: Never Smoker   . Smokeless tobacco: Not on file  . Alcohol Use: Yes     Comment: occ   OB History   Grav Para Term Preterm Abortions TAB SAB Ect Mult Living                 Review of Systems  Respiratory: Positive for cough.   Gastrointestinal: Positive for nausea, vomiting, abdominal pain and diarrhea.  Neurological: Positive for headaches.  All other systems reviewed  and are negative.     Allergies  Penicillins and Percocet  Home Medications   Prior to Admission medications   Medication Sig Start Date End Date Taking? Authorizing Provider  amLODipine (NORVASC) 10 MG tablet Take 5 mg by mouth daily.    Historical Provider, MD  aspirin 81 MG tablet Take 81 mg by mouth daily.    Historical Provider, MD  Canagliflozin (INVOKANA) 100 MG TABS Take 100 mg by mouth daily.    Historical Provider, MD  clopidogrel (PLAVIX) 75 MG tablet Take 75 mg by mouth daily with breakfast.    Historical Provider, MD  cyclobenzaprine (FLEXERIL) 10 MG tablet Take 1 tablet (10 mg total) by mouth 3 (three) times daily as needed for muscle spasms. 03/27/13   Renee A Kuneff, DO  hydrochlorothiazide (HYDRODIURIL) 50 MG tablet Take 50 mg by mouth daily.    Historical Provider, MD  HYDROcodone-acetaminophen (HYCET) 7.5-325 mg/15 ml solution Take 10 mLs by mouth every 4 (four) hours as needed (for pain). 03/31/13   Carlisle BeersJohn L Molpus, MD  HYDROcodone-acetaminophen (NORCO) 7.5-325 MG per tablet Take 1 tablet by mouth every 6 (six) hours as needed for moderate pain. 03/27/13   Renee A Kuneff, DO  insulin glargine (LANTUS) 100 UNIT/ML injection Inject into the skin at bedtime.    Historical Provider, MD  losartan (COZAAR) 25  MG tablet Take 25 mg by mouth daily.    Historical Provider, MD  metoprolol tartrate (LOPRESSOR) 25 MG tablet Take 25 mg by mouth 2 (two) times daily.    Historical Provider, MD  potassium chloride 20 MEQ TBCR Take 10 mEq by mouth 2 (two) times daily. 03/27/13   Renee A Kuneff, DO   BP 143/72  Pulse 78  Temp(Src) 98.3 F (36.8 C) (Oral)  Resp 21  Ht 5\' 9"  (1.753 m)  Wt 332 lb (150.594 kg)  BMI 49.01 kg/m2  SpO2 99%  LMP 07/27/2013 Physical Exam CONSTITUTIONAL: Well developed/well nourished HEAD: Normocephalic/atraumatic EYES: EOMI/PERRL, no icterus  ENMT: Mucous membranes moist NECK: supple no meningeal signs SPINE:entire spine nontender CV: S1/S2 noted, no  murmurs/rubs/gallops noted LUNGS: Lungs are clear to auscultation bilaterally, no apparent distress ABDOMEN: soft, mild epigastric tenderness, no rebound or guarding GU:no cva tenderness NEURO: Pt is awake/alert, moves all extremitiesx4 EXTREMITIES: pulses normal, full ROM SKIN: warm, color normal PSYCH: no abnormalities of mood noted  ED Course  Procedures  DIAGNOSTIC STUDIES: Oxygen Saturation is 99% on RA, normal by my interpretation.    COORDINATION OF CARE: 12:02 AM- Pt advised of plan for treatment which includes medication and labs and pt agrees.  2:01 AM Pt taking PO Labs reassuring Suspect viral syndrome with cough/vomiting/diarrhea Discussed strict return precautions   Labs Review Labs Reviewed  CBC WITH DIFFERENTIAL - Abnormal; Notable for the following:    WBC 12.3 (*)    Platelets 435 (*)    Lymphs Abs 4.2 (*)    All other components within normal limits  COMPREHENSIVE METABOLIC PANEL  LIPASE, BLOOD     EKG Interpretation   Date/Time:  Monday August 06 2013 00:27:53 EDT Ventricular Rate:  71 PR Interval:  198 QRS Duration: 88 QT Interval:  444 QTC Calculation: 482 R Axis:   11 Text Interpretation:  Normal sinus rhythm Nonspecific T wave abnormality  Abnormal ECG No significant change since last tracing Confirmed by  [name]  MD, Dorinda Hill ([id number]) on 08/06/2013 12:34:31 AM      MDM   Final diagnoses:  Vomiting and diarrhea  Epigastric pain  Nonintractable headache, unspecified chronicity pattern, unspecified headache type    Nursing notes including past medical history and social history reviewed and considered in documentation Labs/vital reviewed and considered   I personally performed the services described in this documentation, which was scribed in my presence. The recorded information has been reviewed and is accurate.       Theresa Gaskins, MD 08/06/13 [phone]

## 2013-08-05 NOTE — ED Notes (Signed)
Pt reports abd pain, n/v/d since yesterday with ha.

## 2013-08-06 LAB — COMPREHENSIVE METABOLIC PANEL
ALT: 9 U/L (ref 0–35)
AST: 11 U/L (ref 0–37)
Albumin: 3.7 g/dL (ref 3.5–5.2)
Alkaline Phosphatase: 71 U/L (ref 39–117)
Anion gap: 17 — ABNORMAL HIGH (ref 5–15)
BUN: 15 mg/dL (ref 6–23)
CALCIUM: 9.6 mg/dL (ref 8.4–10.5)
CO2: 26 meq/L (ref 19–32)
CREATININE: 1.1 mg/dL (ref 0.50–1.10)
Chloride: 97 mEq/L (ref 96–112)
GFR, EST AFRICAN AMERICAN: 72 mL/min — AB (ref >90)
GFR, EST NON AFRICAN AMERICAN: 62 mL/min — AB (ref >90)
GLUCOSE: 174 mg/dL — AB (ref 70–99)
Potassium: 3.3 mEq/L — ABNORMAL LOW (ref 3.7–5.3)
Sodium: 140 mEq/L (ref 137–147)
Total Bilirubin: 0.4 mg/dL (ref 0.3–1.2)
Total Protein: 8.3 g/dL (ref 6.0–8.3)

## 2013-08-06 LAB — URINALYSIS, ROUTINE W REFLEX MICROSCOPIC
BILIRUBIN URINE: NEGATIVE
Glucose, UA: >1000 mg/dL — AB
Hgb urine dipstick: NEGATIVE
KETONES UR: NEGATIVE mg/dL
Leukocytes, UA: NEGATIVE
NITRITE: NEGATIVE
PROTEIN: NEGATIVE mg/dL
Specific Gravity, Urine: 1.023 (ref 1.005–1.030)
Urobilinogen, UA: 1 mg/dL (ref 0.0–1.0)
pH: 6 (ref 5.0–8.0)

## 2013-08-06 LAB — CBC WITH DIFFERENTIAL/PLATELET
Basophils Absolute: 0 10*3/uL (ref 0.0–0.1)
Basophils Relative: 0 % (ref 0–1)
EOS PCT: 1 % (ref 0–5)
Eosinophils Absolute: 0.1 10*3/uL (ref 0.0–0.7)
HEMATOCRIT: 36.2 % (ref 36.0–46.0)
Hemoglobin: 12.1 g/dL (ref 12.0–15.0)
LYMPHS ABS: 4.2 10*3/uL — AB (ref 0.7–4.0)
Lymphocytes Relative: 34 % (ref 12–46)
MCH: 26.6 pg (ref 26.0–34.0)
MCHC: 33.4 g/dL (ref 30.0–36.0)
MCV: 79.6 fL (ref 78.0–100.0)
MONOS PCT: 7 % (ref 3–12)
Monocytes Absolute: 0.9 10*3/uL (ref 0.1–1.0)
Neutro Abs: 7 10*3/uL (ref 1.7–7.7)
Neutrophils Relative %: 57 % (ref 43–77)
PLATELETS: 435 10*3/uL — AB (ref 150–400)
RBC: 4.55 MIL/uL (ref 3.87–5.11)
RDW: 14.4 % (ref 11.5–15.5)
WBC: 12.3 10*3/uL — ABNORMAL HIGH (ref 4.0–10.5)

## 2013-08-06 LAB — URINE MICROSCOPIC-ADD ON

## 2013-08-06 LAB — LIPASE, BLOOD: Lipase: 40 U/L (ref 11–59)

## 2013-08-06 LAB — PREGNANCY, URINE: Preg Test, Ur: NEGATIVE

## 2013-08-06 MED ORDER — ONDANSETRON HCL 4 MG/2ML IJ SOLN
4.0000 mg | Freq: Once | INTRAMUSCULAR | Status: AC
  Administered 2013-08-06: 4 mg via INTRAVENOUS
  Filled 2013-08-06: qty 2

## 2013-08-06 MED ORDER — ONDANSETRON HCL 8 MG PO TABS: 8.0000 mg | ORAL_TABLET | Freq: Three times a day (TID) | ORAL | Status: DC | PRN

## 2013-08-06 MED ORDER — SODIUM CHLORIDE 0.9 % IV BOLUS (SEPSIS)
500.0000 mL | Freq: Once | INTRAVENOUS | Status: AC
  Administered 2013-08-06: 500 mL via INTRAVENOUS

## 2013-08-06 NOTE — Discharge Instructions (Signed)
Abdominal (belly) pain can be caused by many things. any cases can be observed and treated at home after initial evaluation in the emergency department. Even though you are being discharged home, abdominal pain can be unpredictable. Therefore, you need a repeated exam if your pain does not resolve, returns, or worsens. Most patients with abdominal pain don't have to be admitted to the hospital or have surgery, but serious problems like appendicitis and gallbladder attacks can start out as nonspecific pain. Many abdominal conditions cannot be diagnosed in one visit, so follow-up evaluations are very important. °SEEK IMMEDIATE MEDICAL ATTENTION IF: °The pain does not go away or becomes severe, particularly over the next 8-12 hours.  °A temperature above 100.4F develops.  °Repeated vomiting occurs (multiple episodes).  °The pain becomes localized to portions of the abdomen. The right side could possibly be appendicitis. In an adult, the left lower portion of the abdomen could be colitis or diverticulitis.  °Blood is being passed in stools or vomit (bright red or black tarry stools).  °Return also if you develop chest pain, difficulty breathing, dizziness or fainting, or become confused, poorly responsive, or inconsolable. ° ° °You are having a headache. No specific cause was found today for your headache. It may have been a migraine or other cause of headache. Stress, anxiety, fatigue, and depression are common triggers for headaches. Your headache today does not appear to be life-threatening or require hospitalization, but often the exact cause of headaches is not determined in the emergency department. Therefore, follow-up with your doctor is very important to find out what may have caused your headache, and whether or not you need any further diagnostic testing or treatment. Sometimes headaches can appear benign (not harmful), but then more serious symptoms can develop which should prompt an immediate re-evaluation  by your doctor or the emergency department. ° °SEEK MEDICAL ATTENTION IF: ° °You develop possible problems with medications prescribed.  °The medications don't resolve your headache, if it recurs , or if you have multiple episodes of vomiting or can't take fluids. °You have a change from the usual headache. ° °RETURN IMMEDIATELY IF you develop a sudden, severe headache or confusion, become poorly responsive or faint, develop a fever above 100.4F or problem breathing, have a change in speech, vision, swallowing, or understanding, or develop new weakness, numbness, tingling, incoordination, or have a seizure. ° °

## 2014-08-02 ENCOUNTER — Encounter (HOSPITAL_BASED_OUTPATIENT_CLINIC_OR_DEPARTMENT_OTHER): Payer: Self-pay | Admitting: *Deleted

## 2014-08-02 ENCOUNTER — Emergency Department (HOSPITAL_BASED_OUTPATIENT_CLINIC_OR_DEPARTMENT_OTHER)
Admission: EM | Admit: 2014-08-02 | Discharge: 2014-08-02 | Disposition: A | Discharge status: Home / Self Care | Payer: Medicaid Other | Attending: Emergency Medicine | Admitting: Emergency Medicine

## 2014-08-02 DIAGNOSIS — E1165 Type 2 diabetes mellitus with hyperglycemia: Secondary | ICD-10-CM | POA: Insufficient documentation

## 2014-08-02 DIAGNOSIS — I10 Essential (primary) hypertension: Secondary | ICD-10-CM | POA: Insufficient documentation

## 2014-08-02 DIAGNOSIS — H539 Unspecified visual disturbance: Secondary | ICD-10-CM | POA: Insufficient documentation

## 2014-08-02 DIAGNOSIS — Z7982 Long term (current) use of aspirin: Secondary | ICD-10-CM | POA: Insufficient documentation

## 2014-08-02 DIAGNOSIS — I252 Old myocardial infarction: Secondary | ICD-10-CM | POA: Insufficient documentation

## 2014-08-02 DIAGNOSIS — Z794 Long term (current) use of insulin: Secondary | ICD-10-CM | POA: Insufficient documentation

## 2014-08-02 DIAGNOSIS — R609 Edema, unspecified: Secondary | ICD-10-CM | POA: Insufficient documentation

## 2014-08-02 DIAGNOSIS — E669 Obesity, unspecified: Secondary | ICD-10-CM | POA: Insufficient documentation

## 2014-08-02 DIAGNOSIS — Z880 Allergy status to penicillin: Secondary | ICD-10-CM | POA: Insufficient documentation

## 2014-08-02 DIAGNOSIS — Z8669 Personal history of other diseases of the nervous system and sense organs: Secondary | ICD-10-CM | POA: Diagnosis not present

## 2014-08-02 DIAGNOSIS — R739 Hyperglycemia, unspecified: Secondary | ICD-10-CM

## 2014-08-02 DIAGNOSIS — Z79899 Other long term (current) drug therapy: Secondary | ICD-10-CM | POA: Diagnosis not present

## 2014-08-02 DIAGNOSIS — Z7902 Long term (current) use of antithrombotics/antiplatelets: Secondary | ICD-10-CM | POA: Diagnosis not present

## 2014-08-02 LAB — CBG MONITORING, ED
GLUCOSE-CAPILLARY: 276 mg/dL — AB (ref 65–99)
GLUCOSE-CAPILLARY: 496 mg/dL — AB (ref 65–99)
Glucose-Capillary: 417 mg/dL — ABNORMAL HIGH (ref 65–99)
Glucose-Capillary: 363 mg/dL — ABNORMAL HIGH (ref 65–99)

## 2014-08-02 LAB — CBC WITH DIFFERENTIAL/PLATELET
Basophils Absolute: 0 10*3/uL (ref 0.0–0.1)
Basophils Relative: 0 % (ref 0–1)
EOS ABS: 0.1 10*3/uL (ref 0.0–0.7)
EOS PCT: 1 % (ref 0–5)
HEMATOCRIT: 38.1 % (ref 36.0–46.0)
HEMOGLOBIN: 13.1 g/dL (ref 12.0–15.0)
LYMPHS PCT: 44 % (ref 12–46)
Lymphs Abs: 4.3 10*3/uL — ABNORMAL HIGH (ref 0.7–4.0)
MCH: 26.6 pg (ref 26.0–34.0)
MCHC: 34.4 g/dL (ref 30.0–36.0)
MCV: 77.4 fL — ABNORMAL LOW (ref 78.0–100.0)
MONO ABS: 0.6 10*3/uL (ref 0.1–1.0)
MONOS PCT: 7 % (ref 3–12)
Neutro Abs: 4.7 10*3/uL (ref 1.7–7.7)
Neutrophils Relative %: 48 % (ref 43–77)
PLATELETS: 416 10*3/uL — AB (ref 150–400)
RBC: 4.92 MIL/uL (ref 3.87–5.11)
RDW: 12.7 % (ref 11.5–15.5)
WBC: 9.8 10*3/uL (ref 4.0–10.5)

## 2014-08-02 LAB — BASIC METABOLIC PANEL
Anion gap: 13 (ref 5–15)
BUN: 14 mg/dL (ref 6–20)
CALCIUM: 8.7 mg/dL — AB (ref 8.9–10.3)
CHLORIDE: 91 mmol/L — AB (ref 101–111)
CO2: 27 mmol/L (ref 22–32)
Creatinine, Ser: 1.08 mg/dL — ABNORMAL HIGH (ref 0.44–1.00)
GFR calc Af Amer: >60 mL/min (ref >60)
GFR calc non Af Amer: >60 mL/min (ref >60)
GLUCOSE: 539 mg/dL — AB (ref 65–99)
Potassium: 3.4 mmol/L — ABNORMAL LOW (ref 3.5–5.1)
Sodium: 131 mmol/L — ABNORMAL LOW (ref 135–145)

## 2014-08-02 MED ORDER — SODIUM CHLORIDE 0.9 % IV BOLUS (SEPSIS)
1000.0000 mL | Freq: Once | INTRAVENOUS | Status: AC
  Administered 2014-08-02: 1000 mL via INTRAVENOUS

## 2014-08-02 MED ORDER — INSULIN ASPART 100 UNIT/ML ~~LOC~~ SOLN
10.0000 [IU] | Freq: Once | SUBCUTANEOUS | Status: AC
  Administered 2014-08-02: 10 [IU] via SUBCUTANEOUS
  Filled 2014-08-02: qty 1

## 2014-08-02 MED ORDER — INSULIN ASPART 100 UNIT/ML ~~LOC~~ SOLN
10.0000 [IU] | Freq: Once | SUBCUTANEOUS | Status: AC
  Administered 2014-08-02: 10 [IU] via INTRAVENOUS

## 2014-08-02 MED ORDER — INSULIN ASPART 100 UNIT/ML ~~LOC~~ SOLN
SUBCUTANEOUS | Status: DC
Start: 2014-08-02 — End: 2014-08-02
  Filled 2014-08-02: qty 1

## 2014-08-02 NOTE — ED Provider Notes (Signed)
CSN: [id number]     Arrival date & time 08/02/14  0003 History   First MD Initiated Contact with Patient 08/02/14 [phone]     Chief Complaint  Patient presents with  . Hyperglycemia     (Consider location/radiation/quality/duration/timing/severity/associated sxs/prior Treatment) HPI  This is a 39 year old female with a history of hypertension, diabetes, coronary artery disease who presents with increasing blood sugars at home. Patient reports a 2 week history of worsening blood sugars. Today her blood sugar was greater than 500 at home. She states over the last several weeks she has had increased urine output, thirst, and fatigue. She states that she was taken off one of her diabetes medications approximate 6 months ago. Her primary physician is having her increase her Lantus every 3 days. She currently takes Lantus at night 48 units.  She states she took her Lantus prior to arrival. Patient denies any recent illnesses or fevers. She denies any chest pain, shortness of breath, abdominal pain, urinary symptoms.  Past Medical History  Diagnosis Date  . Hypertension   . Diabetes mellitus without complication   . Heart attack   . Neuropathy   . Obesity    Past Surgical History  Procedure Laterality Date  . Carotid stent    . Ablation    . Tubal ligation     History reviewed. No pertinent family history. History  Substance Use Topics  . Smoking status: Never Smoker   . Smokeless tobacco: Not on file  . Alcohol Use: Yes     Comment: occ   OB History    No data available     Review of Systems  Constitutional: Positive for fatigue. Negative for fever.  Eyes: Positive for visual disturbance.  Respiratory: Negative for chest tightness and shortness of breath.   Cardiovascular: Negative for chest pain.  Gastrointestinal: Negative for nausea, vomiting and abdominal pain.  Endocrine: Positive for polydipsia and polyuria.  Genitourinary: Negative for dysuria.  Neurological: Negative for  headaches.  All other systems reviewed and are negative.     Allergies  Penicillins and Percocet  Home Medications   Prior to Admission medications   Medication Sig Start Date End Date Taking? Authorizing Provider  pantoprazole (PROTONIX) 40 MG tablet Take 40 mg by mouth daily.   Yes Historical Provider, MD  amLODipine (NORVASC) 10 MG tablet Take 5 mg by mouth daily.    Historical Provider, MD  aspirin 81 MG tablet Take 81 mg by mouth daily.    Historical Provider, MD  clopidogrel (PLAVIX) 75 MG tablet Take 75 mg by mouth daily with breakfast.    Historical Provider, MD  hydrochlorothiazide (HYDRODIURIL) 50 MG tablet Take 50 mg by mouth daily.    Historical Provider, MD  insulin glargine (LANTUS) 100 UNIT/ML injection Inject into the skin at bedtime.    Historical Provider, MD  losartan (COZAAR) 25 MG tablet Take 25 mg by mouth daily.    Historical Provider, MD  metoprolol tartrate (LOPRESSOR) 25 MG tablet Take 25 mg by mouth 2 (two) times daily.    Historical Provider, MD   BP 116/82 mmHg  Pulse 74  Temp(Src) 97.8 F (36.6 C)  Resp 18  Ht  (1.778 m)  Wt 340 lb (154.223 kg)  BMI 48.78 kg/m2  SpO2 99%  LMP 07/19/2014 Physical Exam  Constitutional: She is oriented to person, place, and time. She appears well-developed and well-nourished.  Obese  HENT:  Head: Normocephalic and atraumatic.  Eyes: EOM are normal. Pupils are equal,  round, and reactive to light.  Cardiovascular: Normal rate, regular rhythm and normal heart sounds.   Pulmonary/Chest: Effort normal and breath sounds normal. No respiratory distress. She has no wheezes.  Abdominal: Soft. Bowel sounds are normal. There is no tenderness. There is no rebound.  Musculoskeletal: She exhibits edema.  Neurological: She is alert and oriented to person, place, and time.  Skin: Skin is warm and dry.  Psychiatric: She has a normal mood and affect.  Nursing note and vitals reviewed.   ED Course  Procedures (including  critical care time) Labs Review Labs Reviewed  CBC WITH DIFFERENTIAL/PLATELET - Abnormal; Notable for the following:    MCV 77.4 (*)    Platelets 416 (*)    Lymphs Abs 4.3 (*)    All other components within normal limits  BASIC METABOLIC PANEL - Abnormal; Notable for the following:    Sodium 131 (*)    Potassium 3.4 (*)    Chloride 91 (*)    Glucose, Bld 539 (*)    Creatinine, Ser 1.08 (*)    Calcium 8.7 (*)    All other components within normal limits  CBG MONITORING, ED - Abnormal; Notable for the following:    Glucose-Capillary 496 (*)    All other components within normal limits  CBG MONITORING, ED - Abnormal; Notable for the following:    Glucose-Capillary 417 (*)    All other components within normal limits  CBG MONITORING, ED - Abnormal; Notable for the following:    Glucose-Capillary 363 (*)    All other components within normal limits  CBG MONITORING, ED - Abnormal; Notable for the following:    Glucose-Capillary 276 (*)    All other components within normal limits    Imaging Review No results found.   EKG Interpretation   Date/Time:  Friday August 02 2014 00:52:46 EDT Ventricular Rate:  77 PR Interval:  188 QRS Duration: 94 QT Interval:  442 QTC Calculation: 500 R Axis:   32 Text Interpretation:  Normal sinus rhythm Prolonged QT Abnormal ECG  Confirmed by Khaidyn Staebell  MD, Marquiz Sotelo ([id number]) on 08/02/2014 1:12:20 AM      MDM   Final diagnoses:  Hyperglycemia   Patient presents with high blood sugars at home and 2 week history of generalized fatigue, polydipsia, polyuria, and blurry vision. The symptoms are consistent with persistent hyperglycemia.  Patient denies any infectious symptoms. Blood sugar here initially 496. Patient given fluids and 10 units of subcutaneous insulin. BMP notable for blood sugar of 539 with hyponatremia.  Creatinine at baseline. Patient was given a total of 2 L of fluid and 20 units of insulin. Repeat blood sugar is 276 after several hours  of monitoring. She has no evidence of anion gap or DKA.  EKG was obtained to screen for ischemia given persistent hyperglycemia. This was negative. Discussed with patient close follow-up with her primary physician. She will likely need when necessary sliding scale insulin added to her regimen. She will continue to monitor her blood sugars at home.  After history, exam, and medical workup I feel the patient has been appropriately medically screened and is safe for discharge home. Pertinent diagnoses were discussed with the patient. Patient was given return precautions.     Shon Batonourtney F Sanjuanita Condrey, MD 08/02/14 [phone]

## 2014-08-02 NOTE — ED Notes (Signed)
Patient reports increased urine output, thirst, hunger and lethargy.  Reports blurry vision.  Reports this began approximately 2 weeks ago.

## 2014-08-02 NOTE — ED Notes (Signed)
MD at bedside. 

## 2014-08-02 NOTE — Discharge Instructions (Signed)
You were seen today for high blood sugars.  Your blood sugars improved with fluids and insulin. You need to follow-up closely and as soon as possible with her primary physician regarding insulin adjustment. You should continue to monitor your blood sugars at home.  Hyperglycemia Hyperglycemia occurs when the glucose (sugar) in your blood is too high. Hyperglycemia can happen for many reasons, but it most often happens to people who do not know they have diabetes or are not managing their diabetes properly.  CAUSES  Whether you have diabetes or not, there are other causes of hyperglycemia. Hyperglycemia can occur when you have diabetes, but it can also occur in other situations that you might not be as aware of, such as: Diabetes  If you have diabetes and are having problems controlling your blood glucose, hyperglycemia could occur because of some of the following reasons:  Not following your meal plan.  Not taking your diabetes medications or not taking it properly.  Exercising less or doing less activity than you normally do.  Being sick. Pre-diabetes  This cannot be ignored. Before people develop Type 2 diabetes, they almost always have "pre-diabetes." This is when your blood glucose levels are higher than normal, but not yet high enough to be diagnosed as diabetes. Research has shown that some long-term damage to the body, especially the heart and circulatory system, may already be occurring during pre-diabetes. If you take action to manage your blood glucose when you have pre-diabetes, you may delay or prevent Type 2 diabetes from developing. Stress  If you have diabetes, you may be "diet" controlled or on oral medications or insulin to control your diabetes. However, you may find that your blood glucose is higher than usual in the hospital whether you have diabetes or not. This is often referred to as "stress hyperglycemia." Stress can elevate your blood glucose. This happens because of  hormones put out by the body during times of stress. If stress has been the cause of your high blood glucose, it can be followed regularly by your caregiver. That way he/she can make sure your hyperglycemia does not continue to get worse or progress to diabetes. Steroids  Steroids are medications that act on the infection fighting system (immune system) to block inflammation or infection. One side effect can be a rise in blood glucose. Most people can produce enough extra insulin to allow for this rise, but for those who cannot, steroids make blood glucose levels go even higher. It is not unusual for steroid treatments to "uncover" diabetes that is developing. It is not always possible to determine if the hyperglycemia will go away after the steroids are stopped. A special blood test called an A1c is sometimes done to determine if your blood glucose was elevated before the steroids were started. SYMPTOMS  Thirsty.  Frequent urination.  Dry mouth.  Blurred vision.  Tired or fatigue.  Weakness.  Sleepy.  Tingling in feet or leg. DIAGNOSIS  Diagnosis is made by monitoring blood glucose in one or all of the following ways:  A1c test. This is a chemical found in your blood.  Fingerstick blood glucose monitoring.  Laboratory results. TREATMENT  First, knowing the cause of the hyperglycemia is important before the hyperglycemia can be treated. Treatment may include, but is not be limited to:  Education.  Change or adjustment in medications.  Change or adjustment in meal plan.  Treatment for an illness, infection, etc.  More frequent blood glucose monitoring.  Change in exercise plan.  stopping steroids. °· Lifestyle changes. °HOME CARE INSTRUCTIONS  °· Test your blood glucose as directed. °· Exercise regularly. Your caregiver will give you instructions about exercise. Pre-diabetes or diabetes which comes on with stress is helped by exercising. °· Eat wholesome, balanced  meals. Eat often and at regular, fixed times. Your caregiver or nutritionist will give you a meal plan to guide your sugar intake. °· Being at an ideal weight is important. If needed, losing as little as 10 to 15 pounds may help improve blood glucose levels. °SEEK MEDICAL CARE IF:  °· You have questions about medicine, activity, or diet. °· You continue to have symptoms (problems such as increased thirst, urination, or weight gain). °SEEK IMMEDIATE MEDICAL CARE IF:  °· You are vomiting or have diarrhea. °· Your breath smells fruity. °· You are breathing faster or slower. °· You are very sleepy or incoherent. °· You have numbness, tingling, or pain in your feet or hands. °· You have chest pain. °· Your symptoms get worse even though you have been following your caregiver's orders. °· If you have any other questions or concerns. °Document Released: 07/07/2000 Document Revised: 04/05/2011 Document Reviewed: 05/10/2011 °[employer]® Patient Information ©2015 [employer]. This information is not intended to replace advice given to you by your health care provider. Make sure you discuss any questions you have with your health care provider. ° °

## 2014-08-02 NOTE — ED Notes (Signed)
Pt c/o increased BS at home 500

## 2014-11-07 ENCOUNTER — Encounter (HOSPITAL_BASED_OUTPATIENT_CLINIC_OR_DEPARTMENT_OTHER): Payer: Self-pay

## 2014-11-07 ENCOUNTER — Emergency Department (HOSPITAL_BASED_OUTPATIENT_CLINIC_OR_DEPARTMENT_OTHER)
Admission: EM | Admit: 2014-11-07 | Discharge: 2014-11-07 | Disposition: A | Discharge status: Home / Self Care | Payer: Medicaid Other | Attending: Emergency Medicine | Admitting: Emergency Medicine

## 2014-11-07 DIAGNOSIS — E119 Type 2 diabetes mellitus without complications: Secondary | ICD-10-CM | POA: Insufficient documentation

## 2014-11-07 DIAGNOSIS — Z3202 Encounter for pregnancy test, result negative: Secondary | ICD-10-CM | POA: Insufficient documentation

## 2014-11-07 DIAGNOSIS — R110 Nausea: Secondary | ICD-10-CM | POA: Insufficient documentation

## 2014-11-07 DIAGNOSIS — I10 Essential (primary) hypertension: Secondary | ICD-10-CM | POA: Insufficient documentation

## 2014-11-07 DIAGNOSIS — Z7982 Long term (current) use of aspirin: Secondary | ICD-10-CM | POA: Insufficient documentation

## 2014-11-07 DIAGNOSIS — I252 Old myocardial infarction: Secondary | ICD-10-CM | POA: Insufficient documentation

## 2014-11-07 DIAGNOSIS — Z7902 Long term (current) use of antithrombotics/antiplatelets: Secondary | ICD-10-CM | POA: Insufficient documentation

## 2014-11-07 DIAGNOSIS — Z79899 Other long term (current) drug therapy: Secondary | ICD-10-CM | POA: Insufficient documentation

## 2014-11-07 DIAGNOSIS — E669 Obesity, unspecified: Secondary | ICD-10-CM | POA: Insufficient documentation

## 2014-11-07 DIAGNOSIS — R1013 Epigastric pain: Secondary | ICD-10-CM

## 2014-11-07 DIAGNOSIS — Z8669 Personal history of other diseases of the nervous system and sense organs: Secondary | ICD-10-CM | POA: Insufficient documentation

## 2014-11-07 DIAGNOSIS — Z794 Long term (current) use of insulin: Secondary | ICD-10-CM | POA: Insufficient documentation

## 2014-11-07 DIAGNOSIS — Z880 Allergy status to penicillin: Secondary | ICD-10-CM | POA: Insufficient documentation

## 2014-11-07 LAB — COMPREHENSIVE METABOLIC PANEL
ALBUMIN: 3.5 g/dL (ref 3.5–5.0)
ALK PHOS: 69 U/L (ref 38–126)
ALT: 14 U/L (ref 14–54)
AST: 17 U/L (ref 15–41)
Anion gap: 9 (ref 5–15)
BUN: 13 mg/dL (ref 6–20)
CALCIUM: 8.7 mg/dL — AB (ref 8.9–10.3)
CHLORIDE: 101 mmol/L (ref 101–111)
CO2: 23 mmol/L (ref 22–32)
CREATININE: 0.94 mg/dL (ref 0.44–1.00)
GFR calc non Af Amer: >60 mL/min (ref >60)
GLUCOSE: 342 mg/dL — AB (ref 65–99)
Potassium: 3.7 mmol/L (ref 3.5–5.1)
Sodium: 133 mmol/L — ABNORMAL LOW (ref 135–145)
Total Bilirubin: 0.5 mg/dL (ref 0.3–1.2)
Total Protein: 7.8 g/dL (ref 6.5–8.1)

## 2014-11-07 LAB — URINALYSIS, ROUTINE W REFLEX MICROSCOPIC
Bilirubin Urine: NEGATIVE
Hgb urine dipstick: NEGATIVE
Ketones, ur: NEGATIVE mg/dL
Nitrite: NEGATIVE
PH: 5.5 (ref 5.0–8.0)
Protein, ur: NEGATIVE mg/dL
Specific Gravity, Urine: 1.023 (ref 1.005–1.030)
Urobilinogen, UA: 0.2 mg/dL (ref 0.0–1.0)

## 2014-11-07 LAB — CBC WITH DIFFERENTIAL/PLATELET
Basophils Absolute: 0 10*3/uL (ref 0.0–0.1)
Basophils Relative: 0 %
EOS ABS: 0.2 10*3/uL (ref 0.0–0.7)
Eosinophils Relative: 2 %
HEMATOCRIT: 37.2 % (ref 36.0–46.0)
HEMOGLOBIN: 12.6 g/dL (ref 12.0–15.0)
LYMPHS ABS: 3.9 10*3/uL (ref 0.7–4.0)
LYMPHS PCT: 46 %
MCH: 26.7 pg (ref 26.0–34.0)
MCHC: 33.9 g/dL (ref 30.0–36.0)
MCV: 78.8 fL (ref 78.0–100.0)
MONOS PCT: 9 %
Monocytes Absolute: 0.8 10*3/uL (ref 0.1–1.0)
NEUTROS PCT: 43 %
Neutro Abs: 3.7 10*3/uL (ref 1.7–7.7)
Platelets: 416 10*3/uL — ABNORMAL HIGH (ref 150–400)
RBC: 4.72 MIL/uL (ref 3.87–5.11)
RDW: 13.1 % (ref 11.5–15.5)
WBC: 8.5 10*3/uL (ref 4.0–10.5)

## 2014-11-07 LAB — LIPASE, BLOOD: Lipase: 28 U/L (ref 22–51)

## 2014-11-07 LAB — URINE MICROSCOPIC-ADD ON

## 2014-11-07 LAB — PREGNANCY, URINE: Preg Test, Ur: NEGATIVE

## 2014-11-07 MED ORDER — DIPHENHYDRAMINE HCL 50 MG/ML IJ SOLN
25.0000 mg | Freq: Once | INTRAMUSCULAR | Status: AC
  Administered 2014-11-07: 25 mg via INTRAVENOUS
  Filled 2014-11-07: qty 1

## 2014-11-07 MED ORDER — GI COCKTAIL ~~LOC~~
30.0000 mL | Freq: Once | ORAL | Status: AC
  Administered 2014-11-07: 30 mL via ORAL
  Filled 2014-11-07: qty 30

## 2014-11-07 MED ORDER — SODIUM CHLORIDE 0.9 % IV BOLUS (SEPSIS): 1000.0000 mL | Freq: Once | INTRAVENOUS | Status: DC

## 2014-11-07 MED ORDER — METOCLOPRAMIDE HCL 5 MG/ML IJ SOLN
10.0000 mg | Freq: Once | INTRAMUSCULAR | Status: AC
  Administered 2014-11-07: 10 mg via INTRAVENOUS
  Filled 2014-11-07: qty 2

## 2014-11-07 NOTE — ED Notes (Signed)
Abd pain x 1 week-diarrhea last week none since-pain worse after po

## 2014-11-07 NOTE — Discharge Instructions (Signed)
Try Zantac either twice a day or when you have symptoms. Talk with her family doctor about other medications.

## 2014-11-07 NOTE — ED Provider Notes (Signed)
CSN: [id number]     Arrival date & time 11/07/14  1521 History   First MD Initiated Contact with Patient 11/07/14 1618     Chief Complaint  Patient presents with  . Abdominal Pain     (Consider location/radiation/quality/duration/timing/severity/associated sxs/prior Treatment) Patient is a 39 y.o. female presenting with abdominal pain. The history is provided by the patient.  Abdominal Pain Pain location:  Epigastric Pain quality: burning   Pain radiates to:  LUQ Pain severity:  Severe Onset quality:  Gradual Duration:  1 week Timing:  Constant Progression:  Unchanged Chronicity:  New Relieved by:  Nothing Worsened by:  Nothing tried Ineffective treatments:  None tried Associated symptoms: nausea   Associated symptoms: no chest pain, no chills, no dysuria, no fever, no shortness of breath and no vomiting   Risk factors: obesity     39 yo F with a chief complaints of epigastric abdominal pain. This been going on for about a week. Is worse after eating or drinking. Patient has had some nausea but denies vomiting with this. Patient has been able to the just very bland foods at home. Patient has a history of reflux disease and takes Protonix for it. Patient denies fevers or chills. Patient states she had diarrhea that this was more than 3 days ago and has resolved.  Past Medical History  Diagnosis Date  . Hypertension   . Diabetes mellitus without complication (HCC)   . Heart attack (HCC)   . Neuropathy (HCC)   . Obesity    Past Surgical History  Procedure Laterality Date  . Carotid stent    . Ablation    . Tubal ligation     No family history on file. Social History  Substance Use Topics  . Smoking status: Never Smoker   . Smokeless tobacco: None  . Alcohol Use: Yes     Comment: occ   OB History    No data available     Review of Systems  Constitutional: Negative for fever and chills.  HENT: Negative for congestion and rhinorrhea.   Eyes: Negative for redness  and visual disturbance.  Respiratory: Negative for shortness of breath and wheezing.   Cardiovascular: Negative for chest pain and palpitations.  Gastrointestinal: Positive for nausea and abdominal pain. Negative for vomiting.  Genitourinary: Negative for dysuria and urgency.  Musculoskeletal: Negative for myalgias and arthralgias.  Skin: Negative for pallor and wound.  Neurological: Negative for dizziness and headaches.      Allergies  Hydrocodone; Penicillins; and Percocet  Home Medications   Prior to Admission medications   Medication Sig Start Date End Date Taking? Authorizing Provider  Insulin Aspart (NOVOLOG [place]) Inject into the skin.   Yes Historical Provider, MD  amLODipine (NORVASC) 10 MG tablet Take 5 mg by mouth daily.    Historical Provider, MD  aspirin 81 MG tablet Take 81 mg by mouth daily.    Historical Provider, MD  clopidogrel (PLAVIX) 75 MG tablet Take 75 mg by mouth daily with breakfast.    Historical Provider, MD  hydrochlorothiazide (HYDRODIURIL) 50 MG tablet Take 50 mg by mouth daily.    Historical Provider, MD  insulin glargine (LANTUS) 100 UNIT/ML injection Inject into the skin at bedtime.    Historical Provider, MD  losartan (COZAAR) 25 MG tablet Take 25 mg by mouth daily.    Historical Provider, MD  metoprolol tartrate (LOPRESSOR) 25 MG tablet Take 25 mg by mouth 2 (two) times daily.    Historical Provider, MD  pantoprazole (PROTONIX) 40 MG tablet Take 40 mg by mouth daily.    Historical Provider, MD   BP 135/65 mmHg  Pulse 66  Temp(Src) 97.9 F (36.6 C) (Oral)  Resp 20  Ht 5\' 9"  (1.753 m)  Wt 330 lb (149.687 kg)  BMI 48.71 kg/m2  SpO2 100%  LMP 10/21/2014 Physical Exam  Constitutional: She is oriented to person, place, and time. She appears well-developed and well-nourished. No distress.  HENT:  Head: Normocephalic and atraumatic.  Eyes: EOM are normal. Pupils are equal, round, and reactive to light.  Neck: Normal range of motion. Neck supple.   Cardiovascular: Normal rate and regular rhythm.  Exam reveals no gallop and no friction rub.   No murmur heard. Pulmonary/Chest: Effort normal. She has no wheezes. She has no rales.  Abdominal: Soft. She exhibits no distension. There is tenderness (mild epigastric negative Murphy's). There is no rebound and no guarding.  Musculoskeletal: She exhibits no edema or tenderness.  Neurological: She is alert and oriented to person, place, and time.  Skin: Skin is warm and dry. She is not diaphoretic.  Psychiatric: She has a normal mood and affect. Her behavior is normal.    ED Course  Procedures (including critical care time) Labs Review Labs Reviewed  URINALYSIS, ROUTINE W REFLEX [facility]CROSCOPIC [facility]) - Abnormal; Notable for the following:    Glucose, UA >1000 (*)    Leukocytes, UA SMALL (*)    All other components within normal limits  CBC WITH DIFFERENTIAL/PLATELET - Abnormal; Notable for the following:    Platelets 416 (*)    All other components within normal limits  COMPREHENSIVE METABOLIC PANEL - Abnormal; Notable for the following:    Sodium 133 (*)    Glucose, Bld 342 (*)    Calcium 8.7 (*)    All other components within normal limits  URINE MICROSCOPIC-ADD ON - Abnormal; Notable for the following:    Squamous Epithelial / LPF FEW (*)    Bacteria, UA FEW (*)    All other components within normal limits  PREGNANCY, URINE  LIPASE, BLOOD    Imaging Review No results found. I have personally reviewed and evaluated these images and lab results as part of my medical decision-making.   EKG Interpretation None      MDM   Final diagnoses:  Epigastric abdominal pain    39 yo F with a chief complaint of epigastric abdominal pain. By history sounds likely GERD. We'll give GI cocktail. Laboratory evaluation to rule out pancreatitis or biliary pathology.  Labwork unremarkable. Patient feeling much better after GI cocktail. Will discharge the patient home.   I have  discussed the diagnosis/risks/treatment options with the patient and believe the pt to be eligible for discharge home to follow-up with PCP. We also discussed returning to the ED immediately if new or worsening sx occur. We discussed the sx which are most concerning (e.g., sudden worsening pain, fever, inability to tolerate by mouth ) that necessitate immediate return. Medications administered to the patient during their visit and any new prescriptions provided to the patient are listed below.  Medications given during this visit Medications  metoCLOPramide (REGLAN) injection 10 mg (10 mg Intravenous Given 11/07/14 1712)  diphenhydrAMINE (BENADRYL) injection 25 mg (25 mg Intravenous Given 11/07/14 1712)  gi cocktail (Maalox,Lidocaine,Donnatal) (30 mLs Oral Given 11/07/14 1712)    Discharge Medication List as of 11/07/2014  5:32 PM      The patient appears reasonably screen and/or stabilized for discharge and  I doubt any other medical condition or other [facility] requiring further screening, evaluation, or treatment in the ED at this time prior to discharge.    Melene Plan, DO 11/08/14 1610

## 2015-01-27 ENCOUNTER — Emergency Department (HOSPITAL_BASED_OUTPATIENT_CLINIC_OR_DEPARTMENT_OTHER)
Admission: EM | Admit: 2015-01-27 | Discharge: 2015-01-28 | Disposition: A | Discharge status: Home / Self Care | Payer: Medicaid Other | Attending: Emergency Medicine | Admitting: Emergency Medicine

## 2015-01-27 ENCOUNTER — Emergency Department (HOSPITAL_BASED_OUTPATIENT_CLINIC_OR_DEPARTMENT_OTHER): Payer: Medicaid Other

## 2015-01-27 ENCOUNTER — Encounter (HOSPITAL_BASED_OUTPATIENT_CLINIC_OR_DEPARTMENT_OTHER): Payer: Self-pay | Admitting: *Deleted

## 2015-01-27 DIAGNOSIS — I10 Essential (primary) hypertension: Secondary | ICD-10-CM | POA: Insufficient documentation

## 2015-01-27 DIAGNOSIS — Z7902 Long term (current) use of antithrombotics/antiplatelets: Secondary | ICD-10-CM | POA: Insufficient documentation

## 2015-01-27 DIAGNOSIS — R109 Unspecified abdominal pain: Secondary | ICD-10-CM

## 2015-01-27 DIAGNOSIS — R1012 Left upper quadrant pain: Secondary | ICD-10-CM | POA: Insufficient documentation

## 2015-01-27 DIAGNOSIS — R1110 Vomiting, unspecified: Secondary | ICD-10-CM

## 2015-01-27 DIAGNOSIS — R140 Abdominal distension (gaseous): Secondary | ICD-10-CM | POA: Insufficient documentation

## 2015-01-27 DIAGNOSIS — Z880 Allergy status to penicillin: Secondary | ICD-10-CM | POA: Insufficient documentation

## 2015-01-27 DIAGNOSIS — Z3202 Encounter for pregnancy test, result negative: Secondary | ICD-10-CM | POA: Insufficient documentation

## 2015-01-27 DIAGNOSIS — Z8669 Personal history of other diseases of the nervous system and sense organs: Secondary | ICD-10-CM | POA: Insufficient documentation

## 2015-01-27 DIAGNOSIS — R197 Diarrhea, unspecified: Secondary | ICD-10-CM | POA: Insufficient documentation

## 2015-01-27 DIAGNOSIS — R1013 Epigastric pain: Secondary | ICD-10-CM | POA: Insufficient documentation

## 2015-01-27 DIAGNOSIS — R509 Fever, unspecified: Secondary | ICD-10-CM | POA: Insufficient documentation

## 2015-01-27 DIAGNOSIS — R112 Nausea with vomiting, unspecified: Secondary | ICD-10-CM | POA: Insufficient documentation

## 2015-01-27 DIAGNOSIS — R61 Generalized hyperhidrosis: Secondary | ICD-10-CM | POA: Insufficient documentation

## 2015-01-27 DIAGNOSIS — Z9851 Tubal ligation status: Secondary | ICD-10-CM | POA: Insufficient documentation

## 2015-01-27 DIAGNOSIS — E119 Type 2 diabetes mellitus without complications: Secondary | ICD-10-CM | POA: Insufficient documentation

## 2015-01-27 DIAGNOSIS — Z794 Long term (current) use of insulin: Secondary | ICD-10-CM | POA: Insufficient documentation

## 2015-01-27 DIAGNOSIS — N951 Menopausal and female climacteric states: Secondary | ICD-10-CM | POA: Insufficient documentation

## 2015-01-27 DIAGNOSIS — I252 Old myocardial infarction: Secondary | ICD-10-CM | POA: Insufficient documentation

## 2015-01-27 LAB — URINALYSIS, ROUTINE W REFLEX MICROSCOPIC
Glucose, UA: 250 mg/dL — AB
HGB URINE DIPSTICK: NEGATIVE
KETONES UR: NEGATIVE mg/dL
NITRITE: NEGATIVE
PH: 6 (ref 5.0–8.0)
Protein, ur: 100 mg/dL — AB
Specific Gravity, Urine: 1.025 (ref 1.005–1.030)

## 2015-01-27 LAB — URINE MICROSCOPIC-ADD ON

## 2015-01-27 MED ORDER — MORPHINE SULFATE (PF) 4 MG/ML IV SOLN
4.0000 mg | Freq: Once | INTRAVENOUS | Status: AC
  Administered 2015-01-28: 4 mg via INTRAVENOUS
  Filled 2015-01-27: qty 1

## 2015-01-27 MED ORDER — ONDANSETRON HCL 4 MG/2ML IJ SOLN
4.0000 mg | Freq: Once | INTRAMUSCULAR | Status: AC
  Administered 2015-01-28: 4 mg via INTRAVENOUS
  Filled 2015-01-27: qty 2

## 2015-01-27 NOTE — ED Provider Notes (Signed)
CSN: [id number]     Arrival date & time 01/27/15  2251 History  By signing my name below, I, Budd Palmer, attest that this documentation has been prepared under the direction and in the presence of Shon Baton, MD. Electronically Signed: Budd Palmer, ED Scribe. 01/27/2015. 11:38 PM.     Chief Complaint  Patient presents with  . Abdominal Pain   The history is provided by the patient. No language interpreter was used.   HPI Comments: Theresa Maxwell is a 40 y.o. female with a PMHx of HTN, DM, and neuropathy as well as a PSHx of tubal ligation who presents to the Emergency Department complaining of epigastric pain radiating to the back and down into the buttocks onset 2 days ago. She currently rates her pain as a 10/10. She reports associated abdominal distension, diarrhea (yesterday), nausea, vomiting, subjective fever, hot-flashes, and diaphoresis. She states she has taken 2 hydrocodone without relief. She reports a PMHx of MI and states she is on medication for this, as well as for HTN and DM. She also reports a PMHx of H. pylori for which she is currently on flagyl and bactrim, as well as Protonix. Pt denies blood or bile in her vomit, as well as dysuria.   Pt is allergic to hydrocodone, penicillins, and percocet.   Past Medical History  Diagnosis Date  . Hypertension   . Diabetes mellitus without complication (HCC)   . Heart attack (HCC)   . Neuropathy (HCC)   . Obesity    Past Surgical History  Procedure Laterality Date  . Carotid stent    . Ablation    . Tubal ligation     No family history on file. Social History  Substance Use Topics  . Smoking status: Never Smoker   . Smokeless tobacco: None  . Alcohol Use: Yes     Comment: occ   OB History    No data available     Review of Systems  Constitutional: Positive for fever and diaphoresis.  Respiratory: Negative for chest tightness and shortness of breath.   Cardiovascular: Negative for chest pain.   Gastrointestinal: Positive for nausea, vomiting, abdominal pain, diarrhea and abdominal distention.  Genitourinary: Negative for dysuria.  All other systems reviewed and are negative.   Allergies  Hydrocodone; Penicillins; and Percocet  Home Medications   Prior to Admission medications   Medication Sig Start Date End Date Taking? Authorizing Provider  MetroNIDAZOLE (FLAGYL PO) Take by mouth.   Yes Historical Provider, MD  Sulfamethoxazole-Trimethoprim (BACTRIM PO) Take by mouth.   Yes Historical Provider, MD  amLODipine (NORVASC) 10 MG tablet Take 5 mg by mouth daily.    Historical Provider, MD  aspirin 81 MG tablet Take 81 mg by mouth daily.    Historical Provider, MD  clopidogrel (PLAVIX) 75 MG tablet Take 75 mg by mouth daily with breakfast.    Historical Provider, MD  hydrochlorothiazide (HYDRODIURIL) 50 MG tablet Take 50 mg by mouth daily.    Historical Provider, MD  Insulin Aspart (NOVOLOG [place]) Inject into the skin.    Historical Provider, MD  insulin glargine (LANTUS) 100 UNIT/ML injection Inject into the skin at bedtime.    Historical Provider, MD  loperamide (IMODIUM) 2 MG capsule Take 1 capsule (2 mg total) by mouth 4 (four) times daily as needed for diarrhea or loose stools. 01/28/15   Shon Baton, MD  losartan (COZAAR) 25 MG tablet Take 25 mg by mouth daily.    Historical Provider, MD  metoprolol tartrate (LOPRESSOR) 25 MG tablet Take 25 mg by mouth 2 (two) times daily.    Historical Provider, MD  ondansetron (ZOFRAN ODT) 4 MG disintegrating tablet Take 1 tablet (4 mg total) by mouth every 8 (eight) hours as needed for nausea or vomiting. 01/28/15   Shon Batonourtney F Dietrick Barris, MD  pantoprazole (PROTONIX) 40 MG tablet Take 40 mg by mouth daily.    Historical Provider, MD   BP 117/57 mmHg  Pulse 88  Temp(Src) 98.6 F (37 C) (Oral)  Resp 18  Ht 5\' 9"  (1.753 m)  Wt 333 lb (151.048 kg)  BMI 49.15 kg/m2  SpO2 98%  LMP  Physical Exam  Constitutional: She is oriented to person,  place, and time. No distress.  Morbidly obese  HENT:  Head: Normocephalic and atraumatic.  Cardiovascular: Normal rate, regular rhythm and normal heart sounds.   No murmur heard. Pulmonary/Chest: Effort normal and breath sounds normal. No respiratory distress. She has no wheezes.  Abdominal: Soft. Bowel sounds are normal. There is tenderness. There is no rebound and no guarding.  Mild tenderness to palpation left upper and epigastric region without rebound or guarding  Neurological: She is alert and oriented to person, place, and time.  Skin: Skin is warm and dry.  Psychiatric: She has a normal mood and affect.  Nursing note and vitals reviewed.   ED Course  Procedures  DIAGNOSTIC STUDIES: Oxygen Saturation is 100% on RA, normal by my interpretation.    COORDINATION OF CARE: 11:32 PM - Discussed plans to order diagnostic studies. Pt advised of plan for treatment and pt agrees.  Labs Review Labs Reviewed  URINALYSIS, ROUTINE W REFLEX MICROSCOPIC (NOT AT [facility]) - Abnormal; Notable for the following:    Color, Urine AMBER (*)    APPearance CLOUDY (*)    Glucose, UA 250 (*)    Bilirubin Urine SMALL (*)    Protein, ur 100 (*)    Leukocytes, UA MODERATE (*)    All other components within normal limits  URINE MICROSCOPIC-ADD ON - Abnormal; Notable for the following:    Squamous Epithelial / LPF 6-30 (*)    Bacteria, UA MANY (*)    Casts HYALINE CASTS (*)    All other components within normal limits  CBC WITH DIFFERENTIAL/PLATELET - Abnormal; Notable for the following:    WBC 16.2 (*)    Neutro Abs 13.4 (*)    All other components within normal limits  COMPREHENSIVE METABOLIC PANEL - Abnormal; Notable for the following:    Glucose, Bld 248 (*)    Creatinine, Ser 1.04 (*)    Calcium 8.5 (*)    Total Protein 8.5 (*)    All other components within normal limits  LIPASE, BLOOD  PREGNANCY, URINE    Imaging Review Dg Abd 1 View  01/28/2015  CLINICAL DATA:  Epigastric pain for 2  days. Nausea, vomiting diarrhea EXAM: ABDOMEN - 1 VIEW COMPARISON:  None. FINDINGS: Upper abdomen and both flanks excluded from the field of view secondary to body habitus. The bowel gas pattern is normal. No dilated bowel loops. No radio-opaque calculi. Mild broad-based rightward curvature of the spine may be positional or mild scoliosis. IMPRESSION: Normal bowel gas pattern. Electronically Signed   By: Rubye OaksMelanie  Ehinger M.D.   On: 01/28/2015 01:35   I have personally reviewed and evaluated these images and lab results as part of my medical decision-making.   EKG Interpretation None      MDM   Final diagnoses:  Abdominal pain,  vomiting, and diarrhea    Patient presents with abdominal pain, vomiting, and diarrhea. Tender but nontoxic on exam and no signs of peritonitis. Pain is in the upper abdomen.  Given vomiting and diarrhea, viral etiology is a consideration. She is on treatment for H. Pylori.  Pancreatitis is another consideration. Basic labwork obtained. Patient given pain and nausea medication as well as fluids.  Lab work is largely reassuring. She does have a leukocytosis to 16. This is nonspecific and given her relatively benign exam exam, would defer imaging at this time. Could be related to acute viral process. She has no tenderness to palpation of the right lower quadrant suggestive of appendicitis.  Her lab work is otherwise unremarkable. KUB shows no evidence of obstruction and is nonspecific.  2:12 AM On recheck, patient reports improvement of pain. Discuss with patient supportive care at home with Zofran and Imodium. She was given return precautions including worsening pain or inability to tolerate fluids.  After history, exam, and medical workup I feel the patient has been appropriately medically screened and is safe for discharge home. Pertinent diagnoses were discussed with the patient. Patient was given return precautions.  I personally performed the services described in  this documentation, which was scribed in my presence. The recorded information has been reviewed and is accurate.   Shon Baton, MD 01/28/15 [phone]

## 2015-01-27 NOTE — ED Notes (Signed)
Abdominal pain. Diarrhea since yesterday. States she is bloated. Pain is gas like.

## 2015-01-28 LAB — COMPREHENSIVE METABOLIC PANEL
ALT: 18 U/L (ref 14–54)
AST: 20 U/L (ref 15–41)
Albumin: 3.8 g/dL (ref 3.5–5.0)
Alkaline Phosphatase: 78 U/L (ref 38–126)
Anion gap: 11 (ref 5–15)
BUN: 14 mg/dL (ref 6–20)
CHLORIDE: 102 mmol/L (ref 101–111)
CO2: 22 mmol/L (ref 22–32)
Calcium: 8.5 mg/dL — ABNORMAL LOW (ref 8.9–10.3)
Creatinine, Ser: 1.04 mg/dL — ABNORMAL HIGH (ref 0.44–1.00)
Glucose, Bld: 248 mg/dL — ABNORMAL HIGH (ref 65–99)
POTASSIUM: 4.5 mmol/L (ref 3.5–5.1)
Sodium: 135 mmol/L (ref 135–145)
Total Bilirubin: 0.9 mg/dL (ref 0.3–1.2)
Total Protein: 8.5 g/dL — ABNORMAL HIGH (ref 6.5–8.1)

## 2015-01-28 LAB — CBC WITH DIFFERENTIAL/PLATELET
BASOS ABS: 0 10*3/uL (ref 0.0–0.1)
BASOS PCT: 0 %
Eosinophils Absolute: 0 10*3/uL (ref 0.0–0.7)
Eosinophils Relative: 0 %
HCT: 38.4 % (ref 36.0–46.0)
Hemoglobin: 12.6 g/dL (ref 12.0–15.0)
Lymphocytes Relative: 11 %
Lymphs Abs: 1.8 10*3/uL (ref 0.7–4.0)
MCH: 26.2 pg (ref 26.0–34.0)
MCHC: 32.8 g/dL (ref 30.0–36.0)
MCV: 79.8 fL (ref 78.0–100.0)
MONOS PCT: 6 %
Monocytes Absolute: 1 10*3/uL (ref 0.1–1.0)
NEUTROS ABS: 13.4 10*3/uL — AB (ref 1.7–7.7)
NEUTROS PCT: 83 %
PLATELETS: 355 10*3/uL (ref 150–400)
RBC: 4.81 MIL/uL (ref 3.87–5.11)
RDW: 13.7 % (ref 11.5–15.5)
WBC: 16.2 10*3/uL — ABNORMAL HIGH (ref 4.0–10.5)

## 2015-01-28 LAB — LIPASE, BLOOD: LIPASE: 17 U/L (ref 11–51)

## 2015-01-28 LAB — PREGNANCY, URINE: PREG TEST UR: NEGATIVE

## 2015-01-28 MED ORDER — LOPERAMIDE HCL 2 MG PO CAPS: 2.0000 mg | ORAL_CAPSULE | Freq: Four times a day (QID) | ORAL | Status: AC | PRN

## 2015-01-28 MED ORDER — ONDANSETRON 4 MG PO TBDP: 4.0000 mg | ORAL_TABLET | Freq: Three times a day (TID) | ORAL | Status: DC | PRN

## 2015-01-28 MED ORDER — SODIUM CHLORIDE 0.9 % IV BOLUS (SEPSIS)
1000.0000 mL | Freq: Once | INTRAVENOUS | Status: AC
  Administered 2015-01-28: 1000 mL via INTRAVENOUS

## 2015-01-28 NOTE — ED Notes (Signed)
Patient stable and ambulatory.  Patient verbalizes understanding of discharge medications, instructions and follow-up. 

## 2015-01-28 NOTE — ED Notes (Signed)
Patient given water to drink. Patient able to hold fluids down without vomiting.

## 2015-01-28 NOTE — ED Notes (Signed)
Patient was only able to receive of NS. Before IV infiltrated

## 2015-01-28 NOTE — Discharge Instructions (Signed)

## 2015-02-26 ENCOUNTER — Encounter (HOSPITAL_BASED_OUTPATIENT_CLINIC_OR_DEPARTMENT_OTHER): Payer: Self-pay

## 2015-02-26 ENCOUNTER — Emergency Department (HOSPITAL_BASED_OUTPATIENT_CLINIC_OR_DEPARTMENT_OTHER)
Admission: EM | Admit: 2015-02-26 | Discharge: 2015-02-27 | Disposition: A | Discharge status: Home / Self Care | Payer: Self-pay | Attending: Emergency Medicine | Admitting: Emergency Medicine

## 2015-02-26 DIAGNOSIS — Z955 Presence of coronary angioplasty implant and graft: Secondary | ICD-10-CM | POA: Insufficient documentation

## 2015-02-26 DIAGNOSIS — I252 Old myocardial infarction: Secondary | ICD-10-CM | POA: Insufficient documentation

## 2015-02-26 DIAGNOSIS — Z79899 Other long term (current) drug therapy: Secondary | ICD-10-CM | POA: Insufficient documentation

## 2015-02-26 DIAGNOSIS — I10 Essential (primary) hypertension: Secondary | ICD-10-CM | POA: Insufficient documentation

## 2015-02-26 DIAGNOSIS — Z794 Long term (current) use of insulin: Secondary | ICD-10-CM | POA: Insufficient documentation

## 2015-02-26 DIAGNOSIS — M5432 Sciatica, left side: Secondary | ICD-10-CM | POA: Insufficient documentation

## 2015-02-26 DIAGNOSIS — Z7902 Long term (current) use of antithrombotics/antiplatelets: Secondary | ICD-10-CM | POA: Insufficient documentation

## 2015-02-26 DIAGNOSIS — E119 Type 2 diabetes mellitus without complications: Secondary | ICD-10-CM | POA: Insufficient documentation

## 2015-02-26 DIAGNOSIS — I2510 Atherosclerotic heart disease of native coronary artery without angina pectoris: Secondary | ICD-10-CM | POA: Insufficient documentation

## 2015-02-26 DIAGNOSIS — Z7982 Long term (current) use of aspirin: Secondary | ICD-10-CM | POA: Insufficient documentation

## 2015-02-26 DIAGNOSIS — Z880 Allergy status to penicillin: Secondary | ICD-10-CM | POA: Insufficient documentation

## 2015-02-26 DIAGNOSIS — Z8669 Personal history of other diseases of the nervous system and sense organs: Secondary | ICD-10-CM | POA: Insufficient documentation

## 2015-02-26 NOTE — ED Notes (Signed)
Pt c/o left leg pain from buttock down to toes for the last several weeks.  Pt denies incontinence, denies numbness is groin, c/o numbness in toes.

## 2015-02-27 MED ORDER — OXYCODONE-ACETAMINOPHEN 5-325 MG PO TABS
2.0000 | ORAL_TABLET | Freq: Once | ORAL | Status: AC
  Administered 2015-02-27: 2 via ORAL
  Filled 2015-02-27: qty 2

## 2015-02-27 MED ORDER — KETOROLAC TROMETHAMINE 60 MG/2ML IM SOLN
60.0000 mg | Freq: Once | INTRAMUSCULAR | Status: AC
  Administered 2015-02-27: 60 mg via INTRAMUSCULAR
  Filled 2015-02-27: qty 2

## 2015-02-27 MED ORDER — OXYCODONE-ACETAMINOPHEN 5-325 MG PO TABS: 1.0000 | ORAL_TABLET | Freq: Four times a day (QID) | ORAL | Status: DC | PRN

## 2015-02-27 MED ORDER — PREDNISONE 10 MG PO TABS: 20.0000 mg | ORAL_TABLET | Freq: Two times a day (BID) | ORAL | Status: DC

## 2015-02-27 NOTE — ED Provider Notes (Signed)
CSN: [id number]     Arrival date & time 02/26/15  2334 History   First MD Initiated Contact with Patient 02/26/15 2355     Chief Complaint  Patient presents with  . Leg Pain     (Consider location/radiation/quality/duration/timing/severity/associated sxs/prior Treatment) HPI Comments: Patient is a 40 year old female with history of morbid obesity, hypertension, diabetes, coronary artery disease with MI. She presents for evaluation of pain in her left buttock and left leg. She reports numbness to the left toes. This began in the absence of any injury or trauma has been ongoing for approximately 2 weeks. She denies any bowel or bladder complaints.  Patient is a 40 y.o. female presenting with leg pain. The history is provided by the patient.  Leg Pain Location:  Leg and buttock Time since incident:  2 weeks Injury: no   Buttock location:  L buttock Pain details:    Quality:  Shooting   Radiates to:  Does not radiate   Severity:  Moderate   Onset quality:  Gradual   Timing:  Constant   Progression:  Worsening Chronicity:  New   Past Medical History  Diagnosis Date  . Hypertension   . Diabetes mellitus without complication (HCC)   . Heart attack (HCC)   . Neuropathy (HCC)   . Obesity    Past Surgical History  Procedure Laterality Date  . Carotid stent    . Ablation    . Tubal ligation     No family history on file. Social History  Substance Use Topics  . Smoking status: Never Smoker   . Smokeless tobacco: None  . Alcohol Use: Yes     Comment: occ   OB History    No data available     Review of Systems  All other systems reviewed and are negative.     Allergies  Hydrocodone; Penicillins; and Percocet  Home Medications   Prior to Admission medications   Medication Sig Start Date End Date Taking? Authorizing Provider  amLODipine (NORVASC) 10 MG tablet Take 5 mg by mouth daily.   Yes Historical Provider, MD  aspirin 81 MG tablet Take 81 mg by mouth daily.    Yes Historical Provider, MD  clopidogrel (PLAVIX) 75 MG tablet Take 75 mg by mouth daily with breakfast.   Yes Historical Provider, MD  hydrochlorothiazide (HYDRODIURIL) 50 MG tablet Take 50 mg by mouth daily.   Yes Historical Provider, MD  Insulin Aspart (NOVOLOG [place]) Inject into the skin.   Yes Historical Provider, MD  insulin glargine (LANTUS) 100 UNIT/ML injection Inject into the skin at bedtime.   Yes Historical Provider, MD  loperamide (IMODIUM) 2 MG capsule Take 1 capsule (2 mg total) by mouth 4 (four) times daily as needed for diarrhea or loose stools. 01/28/15  Yes Shon Baton, MD  losartan (COZAAR) 25 MG tablet Take 25 mg by mouth daily.   Yes Historical Provider, MD  metoprolol tartrate (LOPRESSOR) 25 MG tablet Take 25 mg by mouth 2 (two) times daily.   Yes Historical Provider, MD  ondansetron (ZOFRAN ODT) 4 MG disintegrating tablet Take 1 tablet (4 mg total) by mouth every 8 (eight) hours as needed for nausea or vomiting. 01/28/15  Yes Shon Baton, MD  pantoprazole (PROTONIX) 40 MG tablet Take 40 mg by mouth daily.   Yes Historical Provider, MD  MetroNIDAZOLE (FLAGYL PO) Take by mouth.    Historical Provider, MD  Sulfamethoxazole-Trimethoprim (BACTRIM PO) Take by mouth.    Historical Provider, MD  BP 187/108 mmHg  Pulse 77  Temp(Src) 98.2 F (36.8 C) (Oral)  Resp 18  Ht  (1.753 m)  Wt 347 lb 1.6 oz (157.444 kg)  BMI 51.23 kg/m2  SpO2 100%  LMP 01/22/2015 Physical Exam  Constitutional: She is oriented to person, place, and time. She appears well-developed and well-nourished. No distress.  HENT:  Head: Normocephalic and atraumatic.  Neck: Normal range of motion. Neck supple.  Cardiovascular: Normal rate and regular rhythm.  Exam reveals no gallop and no friction rub.   No murmur heard. Pulmonary/Chest: Effort normal and breath sounds normal. No respiratory distress. She has no wheezes.  Abdominal: Soft. Bowel sounds are normal. She exhibits no distension. There  is no tenderness.  Musculoskeletal: Normal range of motion.  There is tenderness to palpation in the left buttock.  Neurological: She is alert and oriented to person, place, and time.  DTRs are 1+ and symmetrical in the bilateral lower extremities. Strength is 5 out of 5 in bilateral lower extremities. She is ambulatory without difficulty.  Skin: Skin is warm and dry. She is not diaphoretic.  Nursing note and vitals reviewed.   ED Course  Procedures (including critical care time) Labs Review Labs Reviewed - No data to display  Imaging Review No results found. I have personally reviewed and evaluated these images and lab results as part of my medical decision-making.   EKG Interpretation None      MDM   Final diagnoses:  None    This appears to be sciatica. This will be treated with prednisone, Percocet, when necessary return.    Geoffery Lyons, MD 02/27/15 [phone]

## 2015-02-27 NOTE — Discharge Instructions (Signed)
Prednisone as prescribed. ° °Percocet as prescribed as needed for pain. ° °Follow-up with your primary Dr. if not improving in the next week. ° ° °Sciatica °Sciatica is pain, weakness, numbness, or tingling along the path of the sciatic nerve. The nerve starts in the lower back and runs down the back of each leg. The nerve controls the muscles in the lower leg and in the back of the knee, while also providing sensation to the back of the thigh, lower leg, and the sole of your foot. Sciatica is a symptom of another medical condition. For instance, nerve damage or certain conditions, such as a herniated disk or bone spur on the spine, pinch or put pressure on the sciatic nerve. This causes the pain, weakness, or other sensations normally associated with sciatica. Generally, sciatica only affects one side of the body. °CAUSES  °· Herniated or slipped disc. °· Degenerative disk disease. °· A pain disorder involving the narrow muscle in the buttocks (piriformis syndrome). °· Pelvic injury or fracture. °· Pregnancy. °· Tumor (rare). °SYMPTOMS  °Symptoms can vary from mild to very severe. The symptoms usually travel from the low back to the buttocks and down the back of the leg. Symptoms can include: °· Mild tingling or dull aches in the lower back, leg, or hip. °· Numbness in the back of the calf or sole of the foot. °· Burning sensations in the lower back, leg, or hip. °· Sharp pains in the lower back, leg, or hip. °· Leg weakness. °· Severe back pain inhibiting movement. °These symptoms may get worse with coughing, sneezing, laughing, or prolonged sitting or standing. Also, being overweight may worsen symptoms. °DIAGNOSIS  °Your caregiver will perform a physical exam to look for common symptoms of sciatica. He or she may ask you to do certain movements or activities that would trigger sciatic nerve pain. Other tests may be performed to find the cause of the sciatica. These may include: °· Blood  tests. °· X-rays. °· Imaging tests, such as an MRI or CT scan. °TREATMENT  °Treatment is directed at the cause of the sciatic pain. Sometimes, treatment is not necessary and the pain and discomfort goes away on its own. If treatment is needed, your caregiver may suggest: °· Over-the-counter medicines to relieve pain. °· Prescription medicines, such as anti-inflammatory medicine, muscle relaxants, or narcotics. °· Applying heat or ice to the painful area. °· Steroid injections to lessen pain, irritation, and inflammation around the nerve. °· Reducing activity during periods of pain. °· Exercising and stretching to strengthen your abdomen and improve flexibility of your spine. Your caregiver may suggest losing weight if the extra weight makes the back pain worse. °· Physical therapy. °· Surgery to eliminate what is pressing or pinching the nerve, such as a bone spur or part of a herniated disk. °HOME CARE INSTRUCTIONS  °· Only take over-the-counter or prescription medicines for pain or discomfort as directed by your caregiver. °· Apply ice to the affected area for 20 minutes, 3-4 times a day for the first 48-72 hours. Then try heat in the same way. °· Exercise, stretch, or perform your usual activities if these do not aggravate your pain. °· Attend physical therapy sessions as directed by your caregiver. °· Keep all follow-up appointments as directed by your caregiver. °· Do not wear high heels or shoes that do not provide proper support. °· Check your mattress to see if it is too soft. A firm mattress may lessen your pain and discomfort. °SEEK   IMMEDIATE MEDICAL CARE IF:   You lose control of your bowel or bladder (incontinence).  You have increasing weakness in the lower back, pelvis, buttocks, or legs.  You have redness or swelling of your back.  You have a burning sensation when you urinate.  You have pain that gets worse when you lie down or awakens you at night.  Your pain is worse than you have  experienced in the past.  Your pain is lasting longer than 4 weeks.  You are suddenly losing weight without reason. MAKE SURE YOU:  Understand these instructions.  Will watch your condition.  Will get help right away if you are not doing well or get worse.   This information is not intended to replace advice given to you by your health care provider. Make sure you discuss any questions you have with your health care provider.   Document Released: 01/05/2001 Document Revised: 10/02/2014 Document Reviewed: 05/23/2011 Elsevier Interactive Patient Education [employer]

## 2015-12-26 ENCOUNTER — Emergency Department (HOSPITAL_BASED_OUTPATIENT_CLINIC_OR_DEPARTMENT_OTHER): Payer: Self-pay

## 2015-12-26 ENCOUNTER — Emergency Department (HOSPITAL_BASED_OUTPATIENT_CLINIC_OR_DEPARTMENT_OTHER)
Admission: EM | Admit: 2015-12-26 | Discharge: 2015-12-27 | Disposition: A | Discharge status: Home / Self Care | Payer: Self-pay | Attending: Emergency Medicine | Admitting: Emergency Medicine

## 2015-12-26 ENCOUNTER — Encounter (HOSPITAL_BASED_OUTPATIENT_CLINIC_OR_DEPARTMENT_OTHER): Payer: Self-pay | Admitting: Emergency Medicine

## 2015-12-26 DIAGNOSIS — J069 Acute upper respiratory infection, unspecified: Secondary | ICD-10-CM | POA: Insufficient documentation

## 2015-12-26 DIAGNOSIS — B9789 Other viral agents as the cause of diseases classified elsewhere: Secondary | ICD-10-CM

## 2015-12-26 DIAGNOSIS — Z7982 Long term (current) use of aspirin: Secondary | ICD-10-CM | POA: Insufficient documentation

## 2015-12-26 DIAGNOSIS — E119 Type 2 diabetes mellitus without complications: Secondary | ICD-10-CM | POA: Insufficient documentation

## 2015-12-26 DIAGNOSIS — Z794 Long term (current) use of insulin: Secondary | ICD-10-CM | POA: Insufficient documentation

## 2015-12-26 DIAGNOSIS — I10 Essential (primary) hypertension: Secondary | ICD-10-CM | POA: Insufficient documentation

## 2015-12-26 NOTE — ED Triage Notes (Signed)
Patient states that she has had generalized ached x 2 -3 days with a cough.

## 2015-12-26 NOTE — ED Provider Notes (Signed)
[facility] Provider Note: [name], MD, FACEP  CSN: [id number] MRN: [id number] ARRIVAL: 12/26/15 at 2108 ROOM: MH09/MH09  By signing my name below, I, Soijett Blue, attest that this documentation has been prepared under the direction and in the presence of Paula LibraJohn Susie Ehresman, MD. Electronically Signed: Soijett Blue, ED Scribe. 12/26/15. 12:02 AM.  CHIEF COMPLAINT  Generalized Body Aches   HISTORY OF PRESENT ILLNESS  Theresa Maxwell is a 40 y.o. female with a PMHx of DM, HTN, who presents to the Emergency Department complaining of generalized body aches onset 2-3 days ago.  Pt is having associated symptoms of scratchy throat, HA, cough, chest pain due to cough, rhinorrhea, sinus pain, nausea, and right ear pain. The headache and sinus pain are worse with coughing. She has not tried any medications for the relief of her symptoms. She denies vomiting, diarrhea, SOB and abdominal pain.  Past Medical History:  Diagnosis Date  . Diabetes mellitus without complication (HCC)   . Heart attack   . Hypertension   . Neuropathy (HCC)   . Obesity     Past Surgical History:  Procedure Laterality Date  . ABLATION    . CAROTID STENT    . TUBAL LIGATION      History reviewed. No pertinent family history.  Social History  Substance Use Topics  . Smoking status: Never Smoker  . Smokeless tobacco: Never Used  . Alcohol use Yes     Comment: occ    Prior to Admission medications   Medication Sig Start Date End Date Taking? Authorizing Provider  amLODipine (NORVASC) 10 MG tablet Take 5 mg by mouth daily.    Historical Provider, MD  aspirin 81 MG tablet Take 81 mg by mouth daily.    Historical Provider, MD  clopidogrel (PLAVIX) 75 MG tablet Take 75 mg by mouth daily with breakfast.    Historical Provider, MD  Dextromethorphan-Guaifenesin (MUCINEX DM MAXIMUM STRENGTH) 60-1200 MG TB12 Take 1 tablet by mouth 2 (two) times daily. 12/27/15   Rubbie Goostree, MD  hydrochlorothiazide  (HYDRODIURIL) 50 MG tablet Take 50 mg by mouth daily.    Historical Provider, MD  Insulin Aspart (NOVOLOG [place]) Inject into the skin.    Historical Provider, MD  insulin glargine (LANTUS) 100 UNIT/ML injection Inject into the skin at bedtime.    Historical Provider, MD  loperamide (IMODIUM) 2 MG capsule Take 1 capsule (2 mg total) by mouth 4 (four) times daily as needed for diarrhea or loose stools. 01/28/15   Shon Batonourtney F Horton, MD  losartan (COZAAR) 25 MG tablet Take 25 mg by mouth daily.    Historical Provider, MD  metoprolol tartrate (LOPRESSOR) 25 MG tablet Take 25 mg by mouth 2 (two) times daily.    Historical Provider, MD  MetroNIDAZOLE (FLAGYL PO) Take by mouth.    Historical Provider, MD  ondansetron (ZOFRAN-ODT) 8 MG disintegrating tablet Take 1 tablet (8 mg total) by mouth every 8 (eight) hours as needed for nausea or vomiting. 12/27/15   Savon Cobbs, MD  oxyCODONE-acetaminophen (PERCOCET) 5-325 MG tablet Take 1-2 tablets by mouth every 6 (six) hours as needed. 02/27/15   Geoffery Lyonsouglas Delo, MD  pantoprazole (PROTONIX) 40 MG tablet Take 40 mg by mouth daily.    Historical Provider, MD  predniSONE (DELTASONE) 10 MG tablet Take 2 tablets (20 mg total) by mouth 2 (two) times daily. 02/27/15   Geoffery Lyonsouglas Delo, MD  Sulfamethoxazole-Trimethoprim (BACTRIM PO) Take by mouth.    Historical Provider, MD  Allergies Hydrocodone; Penicillins; and Percocet [oxycodone-acetaminophen]   REVIEW OF SYSTEMS  Negative except as noted here or in the History of Present Illness.   PHYSICAL EXAMINATION  Initial Vital Signs Blood pressure 182/100, pulse 76, temperature 98.2 F (36.8 C), temperature source Oral, resp. rate 18, height 5\' 5"  (1.651 m), weight (!) 350 lb (158.8 kg), last menstrual period 12/26/2015, SpO2 100 %.  Examination General: Well-developed, Morbidly obese female in no acute distress; appearance consistent with age of record HENT: normocephalic; atraumatic; TM's normal Eyes: pupils equal, round and  reactive to light; extraocular muscles intact Neck: supple Heart: regular rate and rhythm Lungs: clear to auscultation bilaterally Abdomen: soft; obese; nontender; bowel sounds present Extremities: No deformity; full range of motion; pulses normal Neurologic: Awake, alert and oriented; motor function intact in all extremities and symmetric; no facial droop Skin: Warm and dry Psychiatric: Flat affect   RESULTS  Summary of this visit's results, reviewed by myself:   EKG Interpretation  Date/Time:    Ventricular Rate:    PR Interval:    QRS Duration:   QT Interval:    QTC Calculation:   R Axis:     Text Interpretation:        Laboratory Studies: No results found for this or any previous visit (from the past 24 hour(s)). Imaging Studies: Dg Chest 2 View  Result Date: 12/26/2015 CLINICAL DATA:  Cough and generalized aches for 2-3 days EXAM: CHEST  2 VIEW COMPARISON:  None. FINDINGS: There is cardiomegaly with mild vascular congestion. Minimal peribronchial thickening is noted suggestive of bronchitic change. No effusion. No pulmonary edema. No acute osseous abnormality. IMPRESSION: Cardiomegaly with mild vascular congestion. Slight peribronchial thickening suggestive bronchitic change. Electronically Signed   By: Tollie Ethavid  Kwon M.D.   On: 12/26/2015 22:09    ED COURSE  Nursing notes and initial vitals signs, including pulse oximetry, reviewed.  Vitals:   12/26/15 2119  BP: 182/100  Pulse: 76  Resp: 18  Temp: 98.2 F (36.8 C)  TempSrc: Oral  SpO2: 100%  Weight: (!) 350 lb (158.8 kg)  Height: 5\' 5"  (1.651 m)    PROCEDURES    ED DIAGNOSES     ICD-9-CM ICD-10-CM   1. Viral upper respiratory tract infection with cough 465.9 J06.9     B97.89    I personally performed the services described in this documentation, which was scribed in my presence. The recorded information has been reviewed and is accurate.     Paula LibraJohn Adalea Handler, MD 12/27/15 [phone]

## 2015-12-27 MED ORDER — ONDANSETRON 8 MG PO TBDP: 8.0000 mg | ORAL_TABLET | Freq: Three times a day (TID) | ORAL | 0 refills | Status: DC | PRN

## 2015-12-27 MED ORDER — IBUPROFEN 400 MG PO TABS
ORAL_TABLET | ORAL | Status: AC
  Filled 2015-12-27: qty 1

## 2015-12-27 MED ORDER — MUCINEX DM MAXIMUM STRENGTH 60-1200 MG PO TB12: 1.0000 | ORAL_TABLET | Freq: Two times a day (BID) | ORAL | Status: DC

## 2015-12-27 MED ORDER — IBUPROFEN 400 MG PO TABS
400.0000 mg | ORAL_TABLET | Freq: Once | ORAL | Status: AC
  Administered 2015-12-27: 400 mg via ORAL

## 2015-12-27 NOTE — ED Notes (Signed)
Pt verbalizes understanding of d/c instructions and denies any further needs at this time. 

## 2016-04-09 ENCOUNTER — Encounter (HOSPITAL_BASED_OUTPATIENT_CLINIC_OR_DEPARTMENT_OTHER): Payer: Self-pay | Admitting: Emergency Medicine

## 2016-04-09 ENCOUNTER — Emergency Department (HOSPITAL_BASED_OUTPATIENT_CLINIC_OR_DEPARTMENT_OTHER): Payer: Self-pay

## 2016-04-09 ENCOUNTER — Emergency Department (HOSPITAL_BASED_OUTPATIENT_CLINIC_OR_DEPARTMENT_OTHER)
Admission: EM | Admit: 2016-04-09 | Discharge: 2016-04-10 | Disposition: A | Discharge status: Home / Self Care | Payer: Self-pay | Attending: Emergency Medicine | Admitting: Emergency Medicine

## 2016-04-09 DIAGNOSIS — R0789 Other chest pain: Secondary | ICD-10-CM | POA: Insufficient documentation

## 2016-04-09 DIAGNOSIS — I10 Essential (primary) hypertension: Secondary | ICD-10-CM | POA: Insufficient documentation

## 2016-04-09 DIAGNOSIS — Z79899 Other long term (current) drug therapy: Secondary | ICD-10-CM | POA: Insufficient documentation

## 2016-04-09 DIAGNOSIS — Z7982 Long term (current) use of aspirin: Secondary | ICD-10-CM | POA: Insufficient documentation

## 2016-04-09 DIAGNOSIS — Z794 Long term (current) use of insulin: Secondary | ICD-10-CM | POA: Insufficient documentation

## 2016-04-09 DIAGNOSIS — J0100 Acute maxillary sinusitis, unspecified: Secondary | ICD-10-CM | POA: Insufficient documentation

## 2016-04-09 DIAGNOSIS — E119 Type 2 diabetes mellitus without complications: Secondary | ICD-10-CM | POA: Insufficient documentation

## 2016-04-09 LAB — CBC WITH DIFFERENTIAL/PLATELET
Basophils Absolute: 0 10*3/uL (ref 0.0–0.1)
Basophils Relative: 0 %
EOS ABS: 0.2 10*3/uL (ref 0.0–0.7)
EOS PCT: 2 %
HCT: 36.6 % (ref 36.0–46.0)
Hemoglobin: 12.2 g/dL (ref 12.0–15.0)
LYMPHS ABS: 4.5 10*3/uL — AB (ref 0.7–4.0)
Lymphocytes Relative: 44 %
MCH: 26.2 pg (ref 26.0–34.0)
MCHC: 33.3 g/dL (ref 30.0–36.0)
MCV: 78.5 fL (ref 78.0–100.0)
Monocytes Absolute: 0.6 10*3/uL (ref 0.1–1.0)
Monocytes Relative: 6 %
Neutro Abs: 4.9 10*3/uL (ref 1.7–7.7)
Neutrophils Relative %: 48 %
PLATELETS: 467 10*3/uL — AB (ref 150–400)
RBC: 4.66 MIL/uL (ref 3.87–5.11)
RDW: 13.8 % (ref 11.5–15.5)
WBC: 10.2 10*3/uL (ref 4.0–10.5)

## 2016-04-09 LAB — BASIC METABOLIC PANEL
ANION GAP: 8 (ref 5–15)
BUN: 14 mg/dL (ref 6–20)
CALCIUM: 8.9 mg/dL (ref 8.9–10.3)
CO2: 26 mmol/L (ref 22–32)
Chloride: 101 mmol/L (ref 101–111)
Creatinine, Ser: 0.78 mg/dL (ref 0.44–1.00)
Glucose, Bld: 185 mg/dL — ABNORMAL HIGH (ref 65–99)
POTASSIUM: 3.3 mmol/L — AB (ref 3.5–5.1)
SODIUM: 135 mmol/L (ref 135–145)

## 2016-04-09 LAB — RAPID STREP SCREEN (MED CTR MEBANE ONLY): STREPTOCOCCUS, GROUP A SCREEN (DIRECT): NEGATIVE

## 2016-04-09 MED ORDER — DEXAMETHASONE SODIUM PHOSPHATE 10 MG/ML IJ SOLN: 10.0000 mg | Freq: Once | INTRAMUSCULAR | Status: DC

## 2016-04-09 MED ORDER — KETOROLAC TROMETHAMINE 30 MG/ML IJ SOLN
30.0000 mg | Freq: Once | INTRAMUSCULAR | Status: AC
  Administered 2016-04-10: 30 mg via INTRAVENOUS
  Filled 2016-04-09: qty 1

## 2016-04-09 MED ORDER — DEXAMETHASONE SODIUM PHOSPHATE 10 MG/ML IJ SOLN
10.0000 mg | Freq: Once | INTRAMUSCULAR | Status: AC
  Administered 2016-04-10: 10 mg via INTRAVENOUS
  Filled 2016-04-09: qty 1

## 2016-04-09 MED ORDER — KETOROLAC TROMETHAMINE 30 MG/ML IJ SOLN: 60.0000 mg | Freq: Once | INTRAMUSCULAR | Status: DC

## 2016-04-09 NOTE — ED Notes (Signed)
Patient transported to X-ray 

## 2016-04-09 NOTE — ED Notes (Signed)
ED Provider at bedside. 

## 2016-04-09 NOTE — ED Provider Notes (Signed)
TIME SEEN: 11:15 PM  CHIEF COMPLAINT: Chest pain   HPI:  Theresa Maxwell is a 41 y.o. female with a hx of an MI with one stent placement, who presents to the Emergency Department complaining of sudden-onset, moderate, constant right-sided sharp and achy chest pain that began 3 days ago. Chest pain has gradually improved. Patient states her pain is not like prior myocardial infarction. She reports it is worse with palpation of her chest and movement of her right arm. She thinks it is a pulled muscle. No alleviating factors. Patient denies any shortness of breath, diaphoresis, dizziness, nausea, vomiting.  Patient also complains of thinking she has a sinus infection. She reports associated yellow/green nasal congestion, rhinorrhea, left ear pain, anterior neck pain, sore throat, and a non-productive cough. No modifying factors indicated. Known posterior neck pain or stiffness. No headache. No fever.  ROS: See HPI Constitutional: no fever  Eyes: no drainage  ENT: no runny nose   Cardiovascular:  no chest pain  Resp: no SOB  GI: no vomiting GU: no dysuria Integumentary: no rash  Allergy: no hives  Musculoskeletal: no leg swelling  Neurological: no slurred speech ROS otherwise negative  PAST MEDICAL HISTORY/PAST SURGICAL HISTORY:  Past Medical History:  Diagnosis Date  . Diabetes mellitus without complication (HCC)   . Heart attack   . Hypertension   . Neuropathy (HCC)   . Obesity     MEDICATIONS:  Prior to Admission medications   Medication Sig Start Date End Date Taking? Authorizing Provider  amLODipine (NORVASC) 10 MG tablet Take 5 mg by mouth daily.    Historical Provider, MD  aspirin 81 MG tablet Take 81 mg by mouth daily.    Historical Provider, MD  clopidogrel (PLAVIX) 75 MG tablet Take 75 mg by mouth daily with breakfast.    Historical Provider, MD  Dextromethorphan-Guaifenesin (MUCINEX DM MAXIMUM STRENGTH) 60-1200 MG TB12 Take 1 tablet by mouth 2 (two) times daily.  12/27/15   John Molpus, MD  hydrochlorothiazide (HYDRODIURIL) 50 MG tablet Take 50 mg by mouth daily.    Historical Provider, MD  Insulin Aspart (NOVOLOG [place]) Inject into the skin.    Historical Provider, MD  insulin glargine (LANTUS) 100 UNIT/ML injection Inject into the skin at bedtime.    Historical Provider, MD  loperamide (IMODIUM) 2 MG capsule Take 1 capsule (2 mg total) by mouth 4 (four) times daily as needed for diarrhea or loose stools. 01/28/15   Shon Baton, MD  losartan (COZAAR) 25 MG tablet Take 25 mg by mouth daily.    Historical Provider, MD  metoprolol tartrate (LOPRESSOR) 25 MG tablet Take 25 mg by mouth 2 (two) times daily.    Historical Provider, MD  MetroNIDAZOLE (FLAGYL PO) Take by mouth.    Historical Provider, MD  ondansetron (ZOFRAN-ODT) 8 MG disintegrating tablet Take 1 tablet (8 mg total) by mouth every 8 (eight) hours as needed for nausea or vomiting. 12/27/15   John Molpus, MD  oxyCODONE-acetaminophen (PERCOCET) 5-325 MG tablet Take 1-2 tablets by mouth every 6 (six) hours as needed. 02/27/15   Geoffery Lyons, MD  pantoprazole (PROTONIX) 40 MG tablet Take 40 mg by mouth daily.    Historical Provider, MD  predniSONE (DELTASONE) 10 MG tablet Take 2 tablets (20 mg total) by mouth 2 (two) times daily. 02/27/15   Geoffery Lyons, MD  Sulfamethoxazole-Trimethoprim (BACTRIM PO) Take by mouth.    Historical Provider, MD    ALLERGIES:  Allergies  Allergen Reactions  . Hydrocodone Nausea  Only  . Penicillins     Hives throat swells  . Percocet [Oxycodone-Acetaminophen]     Intolerance causes nausea    SOCIAL HISTORY:  Social History  Substance Use Topics  . Smoking status: Never Smoker  . Smokeless tobacco: Never Used  . Alcohol use Yes     Comment: occ    FAMILY HISTORY: History reviewed. No pertinent family history.  EXAM: BP 136/83 (BP Location: Right Arm)   Pulse 77   Temp 98.8 F (37.1 C) (Oral)   Resp 18   Ht 5\' 9"  (1.753 m)   Wt (!) 343 lb (155.6 kg)    SpO2 100%   BMI 50.65 kg/m  CONSTITUTIONAL: Alert and oriented and responds appropriately to questions. Well-appearing; well-nourished HEAD: Normocephalic EYES: Conjunctivae clear, pupils appear equal, EOMI ENT: normal nose; moist mucous membranes; No pharyngeal erythema or petechiae, no tonsillar hypertrophy or exudate, no uvular deviation, no unilateral swelling, no trismus or drooling, no muffled voice, normal phonation, no stridor, no dental caries present, no drainable dental abscess noted, no Ludwig's angina, tongue sits flat in the bottom of the mouth, no angioedema, no facial erythema or warmth, no facial swelling; no pain with movement of the neck.  TMs are clear bilaterally without erythema, purulence, bulging, perforation, effusion.  No cerumen impaction or sign of foreign body in the external auditory canal. No inflammation, erythema or drainage from the external auditory canal. No signs of mastoiditis. No pain with manipulation of the pinna bilaterally. NECK: Supple, no meningismus, no nuchal rigidity, no LAD  CARD: RRR; S1 and S2 appreciated; no murmurs, no clicks, no rubs, no gallops CHEST:  Chest wall is tender to palpation over the right chest wall which reproduces her pain.  No crepitus, ecchymosis, erythema, warmth, rash or other lesions present.   RESP: Normal chest excursion without splinting or tachypnea; breath sounds clear and equal bilaterally; no wheezes, no rhonchi, no rales, no hypoxia or respiratory distress, speaking full sentences ABD/GI: Normal bowel sounds; non-distended; soft, non-tender, no rebound, no guarding, no peritoneal signs, no hepatosplenomegaly BACK:  The back appears normal and is non-tender to palpation, there is no CVA tenderness EXT: Normal ROM in all joints; non-tender to palpation; no edema; normal capillary refill; no cyanosis, no calf tenderness or swelling    SKIN: Normal color for age and race; warm; no rash NEURO: Moves all extremities  equally PSYCH: The patient's mood and manner are appropriate. Grooming and personal hygiene are appropriate.  MEDICAL DECISION MAKING: Patient here with symptoms of sinusitis. Suspect viral versus allergic. No fevers and symptoms have been present less than 1 week. I do not feel antibiotics would be full. Will give dose of Toradol, Decadron for symptomatic relief and discharge with Flonase and Zyrtec. She is comfortable with this plan. Strep test obtained in triage is negative. Nothing at this time to suggest meningitis, deep space neck infection, peritonsillar abscess. Nothing to suggest pneumonia.  Patient also complaining of right-sided chest wall pain. Has had a history of an MI in the past and states this feels different. Pain is been constant for several days and is worse with movement of the arm. Will obtain cardiac labs and chest x-ray although my suspicion for ACS is very low. Doubt pulmonary embolus or dissection. EKG shows no new ischemic abnormality.  ED PROGRESS: Patient's labs are unremarkable. Troponin is negative. Chest x-ray is clear. Patient reports feeling better after Toradol and Decadron. Again my suspicion for life-threatening causes of her chest pain is  low. I feel this is musculoskeletal in nature and have recommended alternating Tylenol and Motrin at home. I do not feel she needs further emergent workup. I do not feel she needs admission. I do not feel she needs serial sets of cardiac enzymes given pain has been constant for several days. Discussed return precautions and recommended follow-up with her primary care physician. She is comfortable with this plan. We'll provide work note.   At this time, I do not feel there is any life-threatening condition present. I have reviewed and discussed all results (EKG, imaging, lab, urine as appropriate) and exam findings with patient/family. I have reviewed nursing notes and appropriate previous records.  I feel the patient is safe to be  discharged home without further emergent workup and can continue workup as an outpatient as needed. Discussed usual and customary return precautions. Patient/family verbalize understanding and are comfortable with this plan.  Outpatient follow-up has been provided if needed. All questions have been answered.     EKG Interpretation  Date/Time:  Friday April 09 2016 23:48:22 EDT Ventricular Rate:  74 PR Interval:    QRS Duration: 108 QT Interval:  432 QTC Calculation: 480 R Axis:   30 Text Interpretation:  Sinus rhythm Low voltage, precordial leads Borderline T abnormalities, anterior leads Baseline wander in lead(s) V2 No significant change since last tracing Confirmed by WARD,  DO, KRISTEN ([id number]) on 04/10/2016 1:42:17 AM       By signing my name below, I, Cynda AcresHailei Fulton, attest that this documentation has been prepared under the direction and in the presence of Kristen N Ward, DO. Electronically Signed: Cynda AcresHailei Fulton, Scribe. 04/09/16. 11:20 PM.    Layla MawKristen N Ward, DO 04/10/16 [id number]

## 2016-04-09 NOTE — ED Triage Notes (Addendum)
Patient states that she started to have a sore throat and "muscle strains" for about 3 days she reports that this is only with movement from side to side. Reports that she can list her hands, and bend over and denies pain with these movement. The patient reports that she does not have any pain just sitting in the chair  The patient also reports that she has left ear pain and neck pain

## 2016-04-10 LAB — TROPONIN I

## 2016-04-10 MED ORDER — CETIRIZINE HCL 10 MG PO CAPS: 10.0000 mg | ORAL_CAPSULE | Freq: Every day | ORAL | 0 refills | Status: DC

## 2016-04-10 MED ORDER — FLUTICASONE PROPIONATE 50 MCG/ACT NA SUSP: 2.0000 | Freq: Every day | NASAL | 0 refills | Status: AC

## 2016-04-10 NOTE — Discharge Instructions (Signed)
You may alternate Tylenol 1000 mg every 6 hours as needed for fever and pain and ibuprofen 800 mg every 8 hours as needed for fever and pain. Please rest and drink plenty of fluids. This is a viral illness causing your symptoms. You do not need antibiotics for a virus. You may use over-the-counter nasal saline spray and Afrin nasal saline spray as needed for nasal congestion. Please do not use Afrin for more than 3 days in a row. You may use Mucinex as needed for cough.  This may take 7-14 days to run its course. ° ° °

## 2016-04-10 NOTE — ED Notes (Signed)
ED Provider at bedside discussing test results and plan of care. 

## 2016-04-11 LAB — CULTURE, GROUP A STREP (THRC)

## 2016-11-20 ENCOUNTER — Emergency Department (HOSPITAL_BASED_OUTPATIENT_CLINIC_OR_DEPARTMENT_OTHER)
Admission: EM | Admit: 2016-11-20 | Discharge: 2016-11-20 | Disposition: A | Discharge status: Home / Self Care | Payer: Self-pay | Attending: Emergency Medicine | Admitting: Emergency Medicine

## 2016-11-20 ENCOUNTER — Encounter (HOSPITAL_BASED_OUTPATIENT_CLINIC_OR_DEPARTMENT_OTHER): Payer: Self-pay | Admitting: Emergency Medicine

## 2016-11-20 ENCOUNTER — Emergency Department (HOSPITAL_BASED_OUTPATIENT_CLINIC_OR_DEPARTMENT_OTHER): Payer: Self-pay

## 2016-11-20 DIAGNOSIS — Z5321 Procedure and treatment not carried out due to patient leaving prior to being seen by health care provider: Secondary | ICD-10-CM | POA: Insufficient documentation

## 2016-11-20 DIAGNOSIS — R05 Cough: Secondary | ICD-10-CM | POA: Insufficient documentation

## 2016-11-20 NOTE — ED Triage Notes (Signed)
Patient states that she went to her dr 2 -3 days ago for a cough - reports that the medications that she was given is not helping and she is unable to sleep because she is coughing so much. Nasal congestion to the right side of her face

## 2016-11-20 NOTE — ED Notes (Signed)
Secretary reports pt left the department.

## 2016-11-20 NOTE — ED Notes (Signed)
Patient transported to X-ray 

## 2016-11-21 ENCOUNTER — Emergency Department (HOSPITAL_BASED_OUTPATIENT_CLINIC_OR_DEPARTMENT_OTHER)
Admission: EM | Admit: 2016-11-21 | Discharge: 2016-11-21 | Disposition: A | Discharge status: Home / Self Care | Payer: Self-pay | Attending: Emergency Medicine | Admitting: Emergency Medicine

## 2016-11-21 ENCOUNTER — Encounter (HOSPITAL_BASED_OUTPATIENT_CLINIC_OR_DEPARTMENT_OTHER): Payer: Self-pay | Admitting: Emergency Medicine

## 2016-11-21 DIAGNOSIS — Z7902 Long term (current) use of antithrombotics/antiplatelets: Secondary | ICD-10-CM | POA: Insufficient documentation

## 2016-11-21 DIAGNOSIS — H9201 Otalgia, right ear: Secondary | ICD-10-CM | POA: Insufficient documentation

## 2016-11-21 DIAGNOSIS — Z7982 Long term (current) use of aspirin: Secondary | ICD-10-CM | POA: Insufficient documentation

## 2016-11-21 DIAGNOSIS — J40 Bronchitis, not specified as acute or chronic: Secondary | ICD-10-CM | POA: Insufficient documentation

## 2016-11-21 DIAGNOSIS — I10 Essential (primary) hypertension: Secondary | ICD-10-CM | POA: Insufficient documentation

## 2016-11-21 DIAGNOSIS — E119 Type 2 diabetes mellitus without complications: Secondary | ICD-10-CM | POA: Insufficient documentation

## 2016-11-21 DIAGNOSIS — Z79899 Other long term (current) drug therapy: Secondary | ICD-10-CM | POA: Insufficient documentation

## 2016-11-21 DIAGNOSIS — Z794 Long term (current) use of insulin: Secondary | ICD-10-CM | POA: Insufficient documentation

## 2016-11-21 DIAGNOSIS — R0602 Shortness of breath: Secondary | ICD-10-CM | POA: Insufficient documentation

## 2016-11-21 LAB — CBG MONITORING, ED: GLUCOSE-CAPILLARY: 277 mg/dL — AB (ref 65–99)

## 2016-11-21 LAB — TROPONIN I: Troponin I: <0.03 ng/mL (ref <0.03)

## 2016-11-21 MED ORDER — BENZONATATE 100 MG PO CAPS: 100.0000 mg | ORAL_CAPSULE | Freq: Three times a day (TID) | ORAL | 0 refills | Status: DC

## 2016-11-21 MED ORDER — PREDNISONE 20 MG PO TABS: 40.0000 mg | ORAL_TABLET | Freq: Every day | ORAL | 0 refills | Status: DC

## 2016-11-21 MED ORDER — ALBUTEROL SULFATE HFA 108 (90 BASE) MCG/ACT IN AERS
2.0000 | INHALATION_SPRAY | Freq: Once | RESPIRATORY_TRACT | Status: AC
  Administered 2016-11-21: 2 via RESPIRATORY_TRACT
  Filled 2016-11-21: qty 6.7

## 2016-11-21 MED ORDER — IPRATROPIUM-ALBUTEROL 0.5-2.5 (3) MG/3ML IN SOLN
3.0000 mL | Freq: Four times a day (QID) | RESPIRATORY_TRACT | Status: DC
  Administered 2016-11-21: 3 mL via RESPIRATORY_TRACT
  Filled 2016-11-21: qty 3

## 2016-11-21 NOTE — ED Triage Notes (Signed)
Dry cough for 4 days, worse at night, R ear pain. Came yesterday but left due to wait time, had neg CXR while waiting.

## 2016-11-21 NOTE — ED Notes (Signed)
FSBS 277

## 2016-11-21 NOTE — Discharge Instructions (Signed)
Take inhaler 2 puffs every 4 hours.  Continue Tessalon for cough.  You can take Robitussin in addition to help your symptoms.  Take prednisone as prescribed until all gone.  Make sure to keep very close eye on your blood sugars because this medication can elevate your sugar.  Add supplemental insulin as needed.  Please follow-up with family doctor in 2-3 days.  Return if worsening.

## 2016-11-21 NOTE — ED Notes (Signed)
Given snack and soda  

## 2016-11-21 NOTE — ED Provider Notes (Signed)
[facility] EMERGENCY DEPARTMENT Provider Note   CSN: [id number] Arrival date & time: 11/21/16  1113     History   Chief Complaint Chief Complaint  Patient presents with  . Cough    HPI Theresa Maxwell is a 41 y.o. female.  HPI  Theresa Maxwell is a 41 y.o. female with hx of DM, MI, HTN, presents to ED with complaint of cough and SOB. Symptoms began 4 days ago, worsening. Pt also reports right ear pain with ringing, started 2 days ago. Pt states nothing is making her symptoms better or worse. Tried tessalon Perles and robitussin for her symptoms with no relief. Pt also reports chest pressure that has been going on for 1 week.  Worse on exertion.  Denies associated shortness of breath.  Denies any increased swelling in her arms and legs. No fever or chills. No n/v/d.    Past Medical History:  Diagnosis Date  . Diabetes mellitus without complication (HCC)   . Heart attack (HCC)   . Hypertension   . Neuropathy   . Obesity     There are no active problems to display for this patient.   Past Surgical History:  Procedure Laterality Date  . ABLATION    . CAROTID STENT    . TUBAL LIGATION      OB History    No data available       Home Medications    Prior to Admission medications   Medication Sig Start Date End Date Taking? Authorizing Provider  amLODipine (NORVASC) 10 MG tablet Take 5 mg by mouth daily.    [provider]  aspirin 81 MG tablet Take 81 mg by mouth daily.    [provider]  Cetirizine HCl (ZYRTEC ALLERGY) 10 MG CAPS Take 1 capsule (10 mg total) by mouth daily. 04/10/16   Ward, Layla MawKristen N, DO  clopidogrel (PLAVIX) 75 MG tablet Take 75 mg by mouth daily with breakfast.    [provider]  Dextromethorphan-Guaifenesin (MUCINEX DM MAXIMUM STRENGTH) 60-1200 MG TB12 Take 1 tablet by mouth 2 (two) times daily. 12/27/15   Molpus, John, MD  fluticasone (FLONASE) 50 MCG/ACT nasal spray Place 2 sprays into both nostrils  daily. 04/10/16   Ward, Layla MawKristen N, DO  hydrochlorothiazide (HYDRODIURIL) 50 MG tablet Take 50 mg by mouth daily.    [provider]  Insulin Aspart (NOVOLOG [place]) Inject into the skin.    [provider]  insulin glargine (LANTUS) 100 UNIT/ML injection Inject into the skin at bedtime.    [provider]  loperamide (IMODIUM) 2 MG capsule Take 1 capsule (2 mg total) by mouth 4 (four) times daily as needed for diarrhea or loose stools. 01/28/15   Horton, Mayer Maskerourtney F, MD  losartan (COZAAR) 25 MG tablet Take 25 mg by mouth daily.    [provider]  metoprolol tartrate (LOPRESSOR) 25 MG tablet Take 25 mg by mouth 2 (two) times daily.    [provider]  MetroNIDAZOLE (FLAGYL PO) Take by mouth.    [provider]  ondansetron (ZOFRAN-ODT) 8 MG disintegrating tablet Take 1 tablet (8 mg total) by mouth every 8 (eight) hours as needed for nausea or vomiting. 12/27/15   Molpus, John, MD  oxyCODONE-acetaminophen (PERCOCET) 5-325 MG tablet Take 1-2 tablets by mouth every 6 (six) hours as needed. 02/27/15   Geoffery Lyonselo, Douglas, MD  pantoprazole (PROTONIX) 40 MG tablet Take 40 mg by mouth daily.    [provider]  predniSONE (DELTASONE)  10 MG tablet Take 2 tablets (20 mg total) by mouth 2 (two) times daily. 02/27/15   Geoffery Lyons, MD  Sulfamethoxazole-Trimethoprim (BACTRIM PO) Take by mouth.    [provider]    Family History No family history on file.  Social History Social History  Substance Use Topics  . Smoking status: Never Smoker  . Smokeless tobacco: Never Used  . Alcohol use Yes     Comment: occ     Allergies   Hydrocodone; Penicillins; and Percocet [oxycodone-acetaminophen]   Review of Systems Review of Systems  Constitutional: Negative for chills and fever.  HENT: Positive for congestion and sore throat.   Respiratory: Positive for cough, chest tightness and shortness of breath.   Cardiovascular: Positive for chest pain.  Negative for palpitations and leg swelling.  Gastrointestinal: Negative for abdominal pain, diarrhea, nausea and vomiting.  Genitourinary: Negative for dysuria, flank pain, pelvic pain, vaginal bleeding, vaginal discharge and vaginal pain.  Musculoskeletal: Negative for arthralgias, myalgias, neck pain and neck stiffness.  Skin: Negative for rash.  Neurological: Negative for dizziness, weakness and headaches.  All other systems reviewed and are negative.    Physical Exam Updated Vital Signs BP (!) 144/81 (BP Location: Left Arm)   Pulse 83   Temp 98.6 F (37 C) (Oral)   Resp 18   Ht 5\' 9"  (1.753 m)   Wt (!) 155.6 kg (343 lb)   SpO2 96%   BMI 50.65 kg/m   Physical Exam  Constitutional: She appears well-developed and well-nourished. No distress.  HENT:  Head: Normocephalic.  Right Ear: External ear normal.  Left Ear: External ear normal.  Mouth/Throat: Oropharynx is clear and moist.  Eyes: Conjunctivae are normal.  Neck: Neck supple.  Cardiovascular: Normal rate, regular rhythm and normal heart sounds.   Pulmonary/Chest: Effort normal. No respiratory distress. She has no wheezes. She has no rales.  Decreased air movement bilaterally  Abdominal: Soft. Bowel sounds are normal. She exhibits no distension. There is no tenderness. There is no rebound.  Musculoskeletal: She exhibits no edema.  Neurological: She is alert.  Skin: Skin is warm and dry.  Psychiatric: She has a normal mood and affect. Her behavior is normal.  Nursing note and vitals reviewed.    ED Treatments / Results  Labs (all labs ordered are listed, but only abnormal results are displayed) Labs Reviewed - No data to display  EKG  EKG Interpretation None       Radiology Dg Chest 2 View  Result Date: 11/20/2016 CLINICAL DATA:  41 year old female with cough. EXAM: CHEST  2 VIEW COMPARISON:  Chest radiograph dated 04/09/2016 FINDINGS: The lungs are clear. There is no pleural effusion or pneumothorax.  There is borderline cardiomegaly. No acute osseous pathology. IMPRESSION: No active cardiopulmonary disease. Electronically Signed   By: Elgie Collard M.D.   On: 11/20/2016 18:11    Procedures Procedures (including critical care time)  Medications Ordered in ED Medications - No data to display   Initial Impression / Assessment and Plan / ED Course  I have reviewed the triage vital signs and the nursing notes.  Pertinent labs & imaging results that were available during my care of the patient were reviewed by me and considered in my medical decision making (see chart for details).     Patient in emergency department with chest pressure, cough, shortness of breath.  She does have history of MI with cath and stenting in 2013.  I reviewed patient's prior records in our chart and  care everywhere.  Will get EKG, troponin.  Chest x-ray was done yesterday, and is normal.  Will give a breathing treatment to see if that will improve her shortness of breath and cough.   1:58 PM Patient's troponin is negative.  EKG with no acute findings.  Her blood sugar is elevated but she states she has not had her insulin yet.  Patient is asking for prescription of prednisone.  Discussed how this medication can elevate your blood sugar further.  She advised me that she will keep an eye on her diet closely and will take supplemental insulin as needed to keep her sugars under control.  I will give her a 5-day course of 40 mg of prednisone.  I will provide her with an inhaler.  This is most likely viral bronchitis.  Continue symptomatic treatment with Tessalon and Robitussin.  Follow-up with family doctor. Return precautions discussed.    Vitals:   11/21/16 1118 11/21/16 1119 11/21/16 1244 11/21/16 1353  BP:  (!) 144/81  132/89  Pulse:  83  82  Resp:  18  18  Temp:  98.6 F (37 C)    TempSrc:  Oral    SpO2:  96% 98% 98%  Weight: (!) 155.6 kg (343 lb)     Height: 5\' 9"  (1.753 m)        Final Clinical  Impressions(s) / ED Diagnoses   Final diagnoses:  Bronchitis    New Prescriptions New Prescriptions   BENZONATATE (TESSALON) 100 MG CAPSULE    Take 1 capsule (100 mg total) by mouth every 8 (eight) hours.   PREDNISONE (DELTASONE) 20 MG TABLET    Take 2 tablets (40 mg total) by mouth daily.     Jaynie Crumble, PA-C 11/21/16 1359    Arby Barrette, MD 11/24/16 2302

## 2019-01-03 ENCOUNTER — Encounter (HOSPITAL_BASED_OUTPATIENT_CLINIC_OR_DEPARTMENT_OTHER): Payer: Self-pay | Admitting: Emergency Medicine

## 2019-01-03 ENCOUNTER — Emergency Department (HOSPITAL_BASED_OUTPATIENT_CLINIC_OR_DEPARTMENT_OTHER)
Admission: EM | Admit: 2019-01-03 | Discharge: 2019-01-03 | Disposition: A | Discharge status: Home / Self Care | Payer: BC Managed Care – PPO | Attending: Emergency Medicine | Admitting: Emergency Medicine

## 2019-01-03 ENCOUNTER — Other Ambulatory Visit: Payer: Self-pay

## 2019-01-03 ENCOUNTER — Emergency Department (HOSPITAL_BASED_OUTPATIENT_CLINIC_OR_DEPARTMENT_OTHER): Payer: BC Managed Care – PPO

## 2019-01-03 DIAGNOSIS — Z79899 Other long term (current) drug therapy: Secondary | ICD-10-CM | POA: Diagnosis not present

## 2019-01-03 DIAGNOSIS — Z7982 Long term (current) use of aspirin: Secondary | ICD-10-CM | POA: Insufficient documentation

## 2019-01-03 DIAGNOSIS — Z880 Allergy status to penicillin: Secondary | ICD-10-CM | POA: Diagnosis not present

## 2019-01-03 DIAGNOSIS — I10 Essential (primary) hypertension: Secondary | ICD-10-CM | POA: Insufficient documentation

## 2019-01-03 DIAGNOSIS — R072 Precordial pain: Secondary | ICD-10-CM | POA: Insufficient documentation

## 2019-01-03 DIAGNOSIS — Z7902 Long term (current) use of antithrombotics/antiplatelets: Secondary | ICD-10-CM | POA: Diagnosis not present

## 2019-01-03 DIAGNOSIS — Z794 Long term (current) use of insulin: Secondary | ICD-10-CM | POA: Diagnosis not present

## 2019-01-03 DIAGNOSIS — I252 Old myocardial infarction: Secondary | ICD-10-CM | POA: Insufficient documentation

## 2019-01-03 DIAGNOSIS — B349 Viral infection, unspecified: Secondary | ICD-10-CM | POA: Diagnosis not present

## 2019-01-03 DIAGNOSIS — R0789 Other chest pain: Secondary | ICD-10-CM | POA: Diagnosis present

## 2019-01-03 DIAGNOSIS — Z885 Allergy status to narcotic agent status: Secondary | ICD-10-CM | POA: Diagnosis not present

## 2019-01-03 DIAGNOSIS — Z20822 Contact with and (suspected) exposure to covid-19: Secondary | ICD-10-CM

## 2019-01-03 DIAGNOSIS — Z20828 Contact with and (suspected) exposure to other viral communicable diseases: Secondary | ICD-10-CM | POA: Diagnosis not present

## 2019-01-03 DIAGNOSIS — E119 Type 2 diabetes mellitus without complications: Secondary | ICD-10-CM | POA: Insufficient documentation

## 2019-01-03 LAB — CBC
HCT: 40.2 % (ref 36.0–46.0)
Hemoglobin: 12.7 g/dL (ref 12.0–15.0)
MCH: 24.9 pg — ABNORMAL LOW (ref 26.0–34.0)
MCHC: 31.6 g/dL (ref 30.0–36.0)
MCV: 78.8 fL — ABNORMAL LOW (ref 80.0–100.0)
Platelets: 372 10*3/uL (ref 150–400)
RBC: 5.1 MIL/uL (ref 3.87–5.11)
RDW: 13.5 % (ref 11.5–15.5)
WBC: 8.9 10*3/uL (ref 4.0–10.5)
nRBC: 0 % (ref 0.0–0.2)

## 2019-01-03 LAB — COMPREHENSIVE METABOLIC PANEL
ALT: 21 U/L (ref 0–44)
AST: 16 U/L (ref 15–41)
Albumin: 3.4 g/dL — ABNORMAL LOW (ref 3.5–5.0)
Alkaline Phosphatase: 96 U/L (ref 38–126)
Anion gap: 6 (ref 5–15)
BUN: 14 mg/dL (ref 6–20)
CO2: 29 mmol/L (ref 22–32)
Calcium: 8.5 mg/dL — ABNORMAL LOW (ref 8.9–10.3)
Chloride: 101 mmol/L (ref 98–111)
Creatinine, Ser: 0.85 mg/dL (ref 0.44–1.00)
GFR calc Af Amer: >60 mL/min (ref >60)
GFR calc non Af Amer: >60 mL/min (ref >60)
Glucose, Bld: 273 mg/dL — ABNORMAL HIGH (ref 70–99)
Potassium: 3.6 mmol/L (ref 3.5–5.1)
Sodium: 136 mmol/L (ref 135–145)
Total Bilirubin: 0.8 mg/dL (ref 0.3–1.2)
Total Protein: 7.6 g/dL (ref 6.5–8.1)

## 2019-01-03 LAB — TROPONIN I (HIGH SENSITIVITY): Troponin I (High Sensitivity): 8 ng/L (ref <18)

## 2019-01-03 NOTE — Discharge Instructions (Signed)
Please read and follow all provided instructions.  Your diagnoses today include:  1. Viral syndrome   2. Precordial pain   3. Close exposure to COVID-19 virus     Tests performed today include:  An EKG of your heart  A chest x-ray -no pneumonia at this time  Cardiac enzymes - a blood test for heart muscle damage  Blood counts and electrolytes  Coronavirus testing -should return by tomorrow  Vital signs. See below for your results today.   Medications prescribed:   None  Take any prescribed medications only as directed.  Follow-up instructions: Please follow-up with your primary care provider as soon as needed for further evaluation of your symptoms.  Return instructions:  SEEK IMMEDIATE MEDICAL ATTENTION IF:  You develop worsening shortness of breath, increased work of breathing, persistent vomiting or high fevers  You have severe chest pain, especially if the pain is crushing or pressure-like and spreads to the arms, back, neck, or jaw, or if you have sweating, nausea (feeling sick to your stomach), or shortness of breath. THIS IS AN EMERGENCY. Don't wait to see if the pain will go away. Get medical help at once. Call 911 or 0 (operator). DO NOT drive yourself to the hospital.   Your chest pain gets worse and does not go away with rest.   You have an attack of chest pain lasting longer than usual, despite rest and treatment with the medications your caregiver has prescribed.   You wake from sleep with chest pain or shortness of breath.  You feel dizzy or faint.  You have chest pain not typical of your usual pain for which you originally saw your caregiver.   You have any other emergent concerns regarding your health.  Your vital signs today were: BP (!) 187/96 (BP Location: Right Arm)    Pulse 79    Temp 98.3 F (36.8 C) (Oral)    Resp 20    Ht 5\' 7"  (1.702 m)    Wt (!) 165.9 kg    LMP 12/27/2018    SpO2 98%    BMI 57.29 kg/m  If your blood pressure (BP) was  elevated above 135/85 this visit, please have this repeated by your doctor within one month. --------------

## 2019-01-03 NOTE — ED Triage Notes (Signed)
Pts husband tested positive for Covid last night.  Pt has been having abdominal pain x1-2 weeks. Runny nose and cough started yesterday.  Pt sts she always has chest pain but she is feeling "heavier" in her chest for the past week and is taking more NTG than usual.

## 2019-01-03 NOTE — ED Notes (Signed)
ED Provider at bedside. 

## 2019-01-03 NOTE — ED Provider Notes (Signed)
[facility] EMERGENCY DEPARTMENT Provider Note   CSN: [id number] Arrival date & time: 01/03/19  0930     History   Chief Complaint Chief Complaint  Patient presents with  . Covid exposure and symptoms    HPI Theresa Maxwell is a 43 y.o. female.     Patient with history of coronary artery disease with stent on Imdur, insulin-dependent diabetes, morbid obesity, hypertension --presents to the emergency department with complaint of chest tightness, cough and rhinorrhea, fatigue, sore throat.  She has also had some vague abdominal pains.  No documented fevers or chills.  Patient feels more tired and weak than normal.  The tightness in her chest is not uncommon however it has been worse recently and she has taken 3 nitroglycerin this past week.  She states that she does not usually take that much in 6 or 8 months.  Patient's husband has a more severe cough and he tested positive for Covid yesterday.  Patient denies any nausea or vomiting or diarrhea.  No urinary symptoms.     Past Medical History:  Diagnosis Date  . Diabetes mellitus without complication (HCC)   . Heart attack (HCC)   . Hypertension   . Neuropathy   . Obesity     There are no active problems to display for this patient.   Past Surgical History:  Procedure Laterality Date  . ABLATION    . CAROTID STENT    . TUBAL LIGATION       OB History   No obstetric history on file.      Home Medications    Prior to Admission medications   Medication Sig Start Date End Date Taking? Authorizing Provider  albuterol (VENTOLIN HFA) 108 (90 Base) MCG/ACT inhaler INHALE 2 PUFFS INTO THE LUNGS EVERY FOUR HOURS AS NEEDED 03/16/18  Yes [provider]  ipratropium (ATROVENT) 0.06 % nasal spray Place into the nose. 05/16/18  Yes [provider]  isosorbide mononitrate (IMDUR) 30 MG 24 hr tablet TAKE 1 TABLET BY MOUTH DAILY. 11/15/18  Yes [provider]  loratadine (CLARITIN) 10 MG  tablet Take by mouth. 10/17/18 01/15/19 Yes [provider]  montelukast (SINGULAIR) 10 MG tablet Take by mouth. 08/04/18  Yes [provider]  nitroGLYCERIN (NITROSTAT) 0.4 MG SL tablet DISSOLVE 1 TABLET UNDER THE TONGUE EVERY 5 MINUTES AS NEEDED FOR CHEST PAIN. IF PAIN CONTINUES AFTER 3 DOSES-CALL 911 05/16/18  Yes [provider]  Olopatadine HCl 0.2 % SOLN Apply to eye. 10/17/18  Yes [provider]  rosuvastatin (CRESTOR) 40 MG tablet Take by mouth. 05/16/18  Yes [provider]  amLODipine (NORVASC) 10 MG tablet Take 5 mg by mouth daily.    [provider]  aspirin 81 MG tablet Take 81 mg by mouth daily.    [provider]  clopidogrel (PLAVIX) 75 MG tablet Take 75 mg by mouth daily with breakfast.    [provider]  fluticasone (FLONASE) 50 MCG/ACT nasal spray Place 2 sprays into both nostrils daily. 04/10/16   Ward, Layla Maw, DO  hydrochlorothiazide (HYDRODIURIL) 50 MG tablet Take 50 mg by mouth daily.    [provider]  Insulin Aspart (NOVOLOG [place]) Inject into the skin.    [provider]  insulin glargine (LANTUS) 100 UNIT/ML injection Inject into the skin at bedtime.    [provider]  loperamide (IMODIUM) 2 MG capsule Take 1 capsule (2 mg total) by mouth 4 (four) times daily as needed for  diarrhea or loose stools. 01/28/15   Horton, Barbette Hair, MD  losartan (COZAAR) 25 MG tablet Take 25 mg by mouth daily.    [provider]  metoprolol tartrate (LOPRESSOR) 25 MG tablet Take 25 mg by mouth 2 (two) times daily.    [provider]  pantoprazole (PROTONIX) 40 MG tablet Take 40 mg by mouth daily.    [provider]    Family History No family history on file.  Social History Social History   Tobacco Use  . Smoking status: Never Smoker  . Smokeless tobacco: Never Used  Substance Use Topics  . Alcohol use: Yes    Comment: occ  . Drug use: No     Allergies    Hydrocodone, Penicillins, and Percocet [oxycodone-acetaminophen]   Review of Systems Review of Systems  Constitutional: Positive for fatigue. Negative for diaphoresis and fever.  HENT: Positive for congestion, rhinorrhea and sore throat.   Eyes: Negative for redness.  Respiratory: Positive for cough and chest tightness. Negative for shortness of breath.   Cardiovascular: Positive for chest pain. Negative for palpitations and leg swelling.  Gastrointestinal: Negative for abdominal pain, diarrhea, nausea and vomiting.  Genitourinary: Negative for dysuria.  Musculoskeletal: Positive for myalgias. Negative for back pain and neck pain.  Skin: Negative for rash.  Neurological: Positive for headaches. Negative for syncope and light-headedness.  Psychiatric/Behavioral: The patient is not nervous/anxious.      Physical Exam Updated Vital Signs BP (!) 187/96 (BP Location: Right Arm)   Pulse 79   Temp 98.3 F (36.8 C) (Oral)   Resp 20   Ht 5\' 7"  (1.702 m)   Wt (!) 165.9 kg   LMP 12/27/2018   SpO2 98%   BMI 57.29 kg/m   Physical Exam Vitals signs and nursing note reviewed.  Constitutional:      Appearance: She is well-developed.  HENT:     Head: Normocephalic and atraumatic.     Nose: Congestion present. No rhinorrhea.  Eyes:     General:        Right eye: No discharge.        Left eye: No discharge.     Conjunctiva/sclera: Conjunctivae normal.  Neck:     Musculoskeletal: Normal range of motion and neck supple.  Cardiovascular:     Rate and Rhythm: Normal rate and regular rhythm.     Heart sounds: Normal heart sounds.  Pulmonary:     Effort: Pulmonary effort is normal.     Breath sounds: Normal breath sounds.  Abdominal:     Palpations: Abdomen is soft.     Tenderness: There is no abdominal tenderness. There is no guarding or rebound.  Skin:    General: Skin is warm and dry.  Neurological:     Mental Status: She is alert.      ED Treatments / Results  Labs (all  labs ordered are listed, but only abnormal results are displayed) Labs Reviewed  CBC - Abnormal; Notable for the following components:      Result Value   MCV 78.8 (*)    MCH 24.9 (*)    All other components within normal limits  COMPREHENSIVE METABOLIC PANEL - Abnormal; Notable for the following components:   Glucose, Bld 273 (*)    Calcium 8.5 (*)    Albumin 3.4 (*)    All other components within normal limits  NOVEL CORONAVIRUS, NAA (HOSP ORDER, SEND-OUT TO REF LAB; TAT 18-24 HRS)  TROPONIN I (HIGH SENSITIVITY)  ED ECG REPORT   Date: 01/03/2019  Rate: 75  Rhythm: normal sinus rhythm  QRS Axis: normal  Intervals: QT prolonged (QTc 533)  ST/T Wave abnormalities: nonspecific T wave changes  Conduction Disutrbances:none  Narrative Interpretation:   Old EKG Reviewed: unchanged -- QTc longer otherwise unchanged from 11/22/16  I have personally reviewed the EKG tracing and agree with the computerized printout as noted.  Radiology No results found.  Procedures Procedures (including critical care time)  Medications Ordered in ED Medications - No data to display   Initial Impression / Assessment and Plan / ED Course  I have reviewed the triage vital signs and the nursing notes.  Pertinent labs & imaging results that were available during my care of the patient were reviewed by me and considered in my medical decision making (see chart for details).        Patient seen and examined.  Symptoms are likely infectious in nature, however given the patient's significant cardiac history will check labs, chest x-ray, troponin, EKG.  Vital signs reviewed and are as follows: BP (!) 187/96 (BP Location: Right Arm)   Pulse 79   Temp 98.3 F (36.8 C) (Oral)   Resp 20   Ht 5\' 7"  (1.702 m)   Wt (!) 165.9 kg   LMP 12/27/2018   SpO2 98%   BMI 57.29 kg/m   11:07 AM work-up is reassuring.  Chest x-ray without pneumonia.  EKG with slightly prolonged QT, however otherwise  unchanged from 2018.  Troponin normal.  Hyperglycemia but normal liver function.  Normal white blood cell count.  Patient will be discharged with instructions to isolate herself until her coronavirus test returns.  We discussed signs and symptoms to return which include increased work of breathing or trouble breathing, high fevers, persistent vomiting, worsening of her chest pain.   Final Clinical Impressions(s) / ED Diagnoses   Final diagnoses:  Viral syndrome  Precordial pain  Close exposure to COVID-19 virus   Patient with chest tightness and viral syndrome features in setting of recent close contact with someone who tested positive for coronavirus.  EKG is essentially unchanged without ischemic features.  Troponin is negative x1.  Chest x-ray is clear without signs of failure.  Symptoms are atypical, nonexertional, not associated with vomiting or diaphoresis.  Low concern for ACS or PE at this time.  Patient is at high risk for COVID-19.  No hypoxia, increased work of breathing, or other severe symptoms to necessitate admission today.   ED Discharge Orders    None       Renne CriglerGeiple, Tavon Corriher, Cordelia Poche-C 01/03/19 1110    Raeford RazorKohut, Stephen, MD 01/04/19 [phone]

## 2019-01-04 ENCOUNTER — Ambulatory Visit: Payer: Self-pay | Admitting: Physician Assistant

## 2019-01-04 LAB — NOVEL CORONAVIRUS, NAA (HOSP ORDER, SEND-OUT TO REF LAB; TAT 18-24 HRS): SARS-CoV-2, NAA: NOT DETECTED

## 2021-05-17 IMAGING — DX DG CHEST 1V PORT
1 series · 1 of 1 positions shown · non-contrast
Comparison: 11/20/2016

CLINICAL DATA: COVID chest pain

EXAM:
PORTABLE CHEST 1 VIEW

[chest ap]
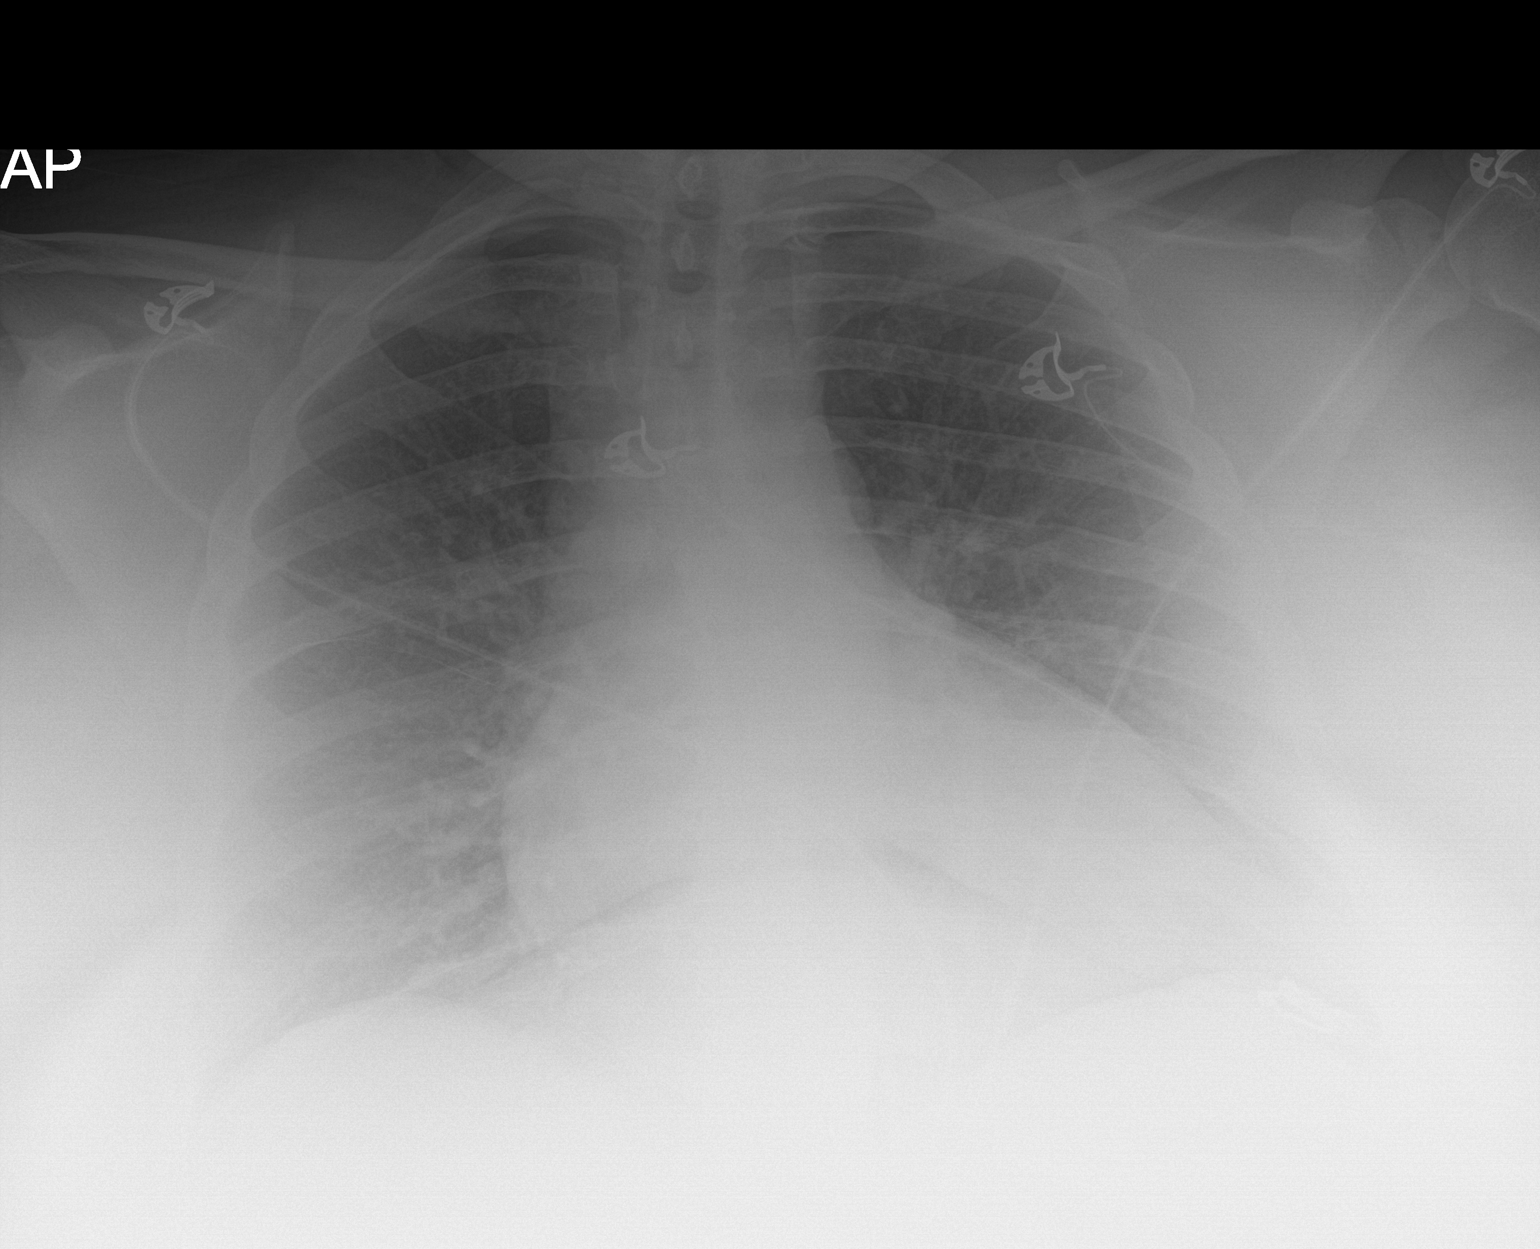

[1 of 1 positions shown; findings below may reference images not displayed]

FINDINGS: The heart size and mediastinal contours are within normal limits.
Both lungs are clear. The visualized skeletal structures are
unremarkable.
IMPRESSION: No acute cardiopulmonary disease.
